# Patient Record
Sex: Female | Born: 1950 | Race: Black or African American | Hispanic: No | Marital: Single | Smoking: Former smoker
Health system: Southern US, Community
[De-identification: ages and names within clinical notes are randomized; demographics above are authoritative.]

## PROBLEM LIST (undated history)

## (undated) DIAGNOSIS — E119 Type 2 diabetes mellitus without complications: Secondary | ICD-10-CM

## (undated) DIAGNOSIS — E7800 Pure hypercholesterolemia, unspecified: Secondary | ICD-10-CM

## (undated) DIAGNOSIS — I10 Essential (primary) hypertension: Secondary | ICD-10-CM

## (undated) DIAGNOSIS — F329 Major depressive disorder, single episode, unspecified: Secondary | ICD-10-CM

## (undated) DIAGNOSIS — F32A Depression, unspecified: Secondary | ICD-10-CM

## (undated) DIAGNOSIS — F419 Anxiety disorder, unspecified: Secondary | ICD-10-CM

## (undated) DIAGNOSIS — M1990 Unspecified osteoarthritis, unspecified site: Secondary | ICD-10-CM

## (undated) HISTORY — DX: Depression, unspecified: F32.A

## (undated) HISTORY — DX: Major depressive disorder, single episode, unspecified: F32.9

## (undated) HISTORY — DX: Type 2 diabetes mellitus without complications: E11.9

## (undated) HISTORY — PX: COLONOSCOPY: SHX174

## (undated) HISTORY — DX: Anxiety disorder, unspecified: F41.9

---

## 2004-08-15 ENCOUNTER — Ambulatory Visit: Payer: Self-pay | Admitting: Internal Medicine

## 2005-09-18 ENCOUNTER — Ambulatory Visit: Payer: Self-pay | Admitting: Gastroenterology

## 2005-12-28 ENCOUNTER — Ambulatory Visit: Payer: Self-pay | Admitting: Internal Medicine

## 2006-01-15 ENCOUNTER — Ambulatory Visit: Payer: Self-pay | Admitting: Internal Medicine

## 2006-10-01 ENCOUNTER — Ambulatory Visit: Payer: Self-pay | Admitting: Internal Medicine

## 2007-03-07 ENCOUNTER — Ambulatory Visit: Payer: Self-pay | Admitting: Internal Medicine

## 2008-03-09 ENCOUNTER — Ambulatory Visit: Payer: Self-pay | Admitting: Internal Medicine

## 2009-09-09 ENCOUNTER — Ambulatory Visit: Payer: Self-pay | Admitting: Gastroenterology

## 2009-10-06 ENCOUNTER — Ambulatory Visit: Payer: Self-pay | Admitting: Internal Medicine

## 2010-03-10 ENCOUNTER — Ambulatory Visit: Payer: Self-pay | Admitting: Internal Medicine

## 2012-01-23 ENCOUNTER — Ambulatory Visit: Payer: Self-pay

## 2013-12-22 ENCOUNTER — Ambulatory Visit: Payer: Self-pay | Admitting: Internal Medicine

## 2014-06-06 DIAGNOSIS — E7800 Pure hypercholesterolemia, unspecified: Secondary | ICD-10-CM | POA: Insufficient documentation

## 2014-06-06 DIAGNOSIS — E1169 Type 2 diabetes mellitus with other specified complication: Secondary | ICD-10-CM | POA: Insufficient documentation

## 2014-06-06 DIAGNOSIS — E782 Mixed hyperlipidemia: Secondary | ICD-10-CM

## 2014-06-06 DIAGNOSIS — Z800 Family history of malignant neoplasm of digestive organs: Secondary | ICD-10-CM | POA: Insufficient documentation

## 2014-06-08 DIAGNOSIS — M170 Bilateral primary osteoarthritis of knee: Secondary | ICD-10-CM | POA: Insufficient documentation

## 2015-06-18 DIAGNOSIS — Z79899 Other long term (current) drug therapy: Secondary | ICD-10-CM | POA: Insufficient documentation

## 2015-06-22 DIAGNOSIS — E559 Vitamin D deficiency, unspecified: Secondary | ICD-10-CM | POA: Insufficient documentation

## 2016-01-04 ENCOUNTER — Other Ambulatory Visit: Payer: Self-pay | Admitting: Internal Medicine

## 2016-01-04 DIAGNOSIS — Z800 Family history of malignant neoplasm of digestive organs: Secondary | ICD-10-CM | POA: Diagnosis not present

## 2016-01-04 DIAGNOSIS — Z23 Encounter for immunization: Secondary | ICD-10-CM | POA: Diagnosis not present

## 2016-01-04 DIAGNOSIS — Z1239 Encounter for other screening for malignant neoplasm of breast: Secondary | ICD-10-CM | POA: Diagnosis not present

## 2016-01-04 DIAGNOSIS — E7800 Pure hypercholesterolemia, unspecified: Secondary | ICD-10-CM | POA: Diagnosis not present

## 2016-01-04 DIAGNOSIS — E559 Vitamin D deficiency, unspecified: Secondary | ICD-10-CM | POA: Diagnosis not present

## 2016-01-04 DIAGNOSIS — Z1231 Encounter for screening mammogram for malignant neoplasm of breast: Secondary | ICD-10-CM

## 2016-01-04 DIAGNOSIS — I10 Essential (primary) hypertension: Secondary | ICD-10-CM | POA: Diagnosis not present

## 2016-01-04 DIAGNOSIS — Z6841 Body Mass Index (BMI) 40.0 and over, adult: Secondary | ICD-10-CM | POA: Insufficient documentation

## 2016-01-04 DIAGNOSIS — M170 Bilateral primary osteoarthritis of knee: Secondary | ICD-10-CM | POA: Diagnosis not present

## 2016-01-04 DIAGNOSIS — Z780 Asymptomatic menopausal state: Secondary | ICD-10-CM | POA: Diagnosis not present

## 2016-01-04 DIAGNOSIS — J301 Allergic rhinitis due to pollen: Secondary | ICD-10-CM | POA: Diagnosis not present

## 2016-01-04 DIAGNOSIS — Z0000 Encounter for general adult medical examination without abnormal findings: Secondary | ICD-10-CM | POA: Diagnosis not present

## 2016-01-04 DIAGNOSIS — E119 Type 2 diabetes mellitus without complications: Secondary | ICD-10-CM | POA: Diagnosis not present

## 2016-01-11 DIAGNOSIS — Z780 Asymptomatic menopausal state: Secondary | ICD-10-CM | POA: Diagnosis not present

## 2016-01-13 ENCOUNTER — Other Ambulatory Visit: Payer: Self-pay | Admitting: Internal Medicine

## 2016-01-13 ENCOUNTER — Ambulatory Visit
Admission: RE | Admit: 2016-01-13 | Discharge: 2016-01-13 | Disposition: A | Discharge status: Home / Self Care | Payer: PPO | Source: Ambulatory Visit | Attending: Internal Medicine | Admitting: Internal Medicine

## 2016-01-13 DIAGNOSIS — Z1231 Encounter for screening mammogram for malignant neoplasm of breast: Secondary | ICD-10-CM | POA: Insufficient documentation

## 2016-03-28 DIAGNOSIS — Z800 Family history of malignant neoplasm of digestive organs: Secondary | ICD-10-CM | POA: Diagnosis not present

## 2016-03-28 DIAGNOSIS — K5730 Diverticulosis of large intestine without perforation or abscess without bleeding: Secondary | ICD-10-CM | POA: Diagnosis not present

## 2016-06-15 ENCOUNTER — Encounter: Payer: Self-pay | Admitting: *Deleted

## 2016-06-16 DIAGNOSIS — E7800 Pure hypercholesterolemia, unspecified: Secondary | ICD-10-CM | POA: Diagnosis not present

## 2016-06-16 DIAGNOSIS — E119 Type 2 diabetes mellitus without complications: Secondary | ICD-10-CM | POA: Diagnosis not present

## 2016-06-16 DIAGNOSIS — Z23 Encounter for immunization: Secondary | ICD-10-CM | POA: Diagnosis not present

## 2016-06-16 DIAGNOSIS — Z800 Family history of malignant neoplasm of digestive organs: Secondary | ICD-10-CM | POA: Diagnosis not present

## 2016-06-16 DIAGNOSIS — I10 Essential (primary) hypertension: Secondary | ICD-10-CM | POA: Diagnosis not present

## 2016-06-16 DIAGNOSIS — E559 Vitamin D deficiency, unspecified: Secondary | ICD-10-CM | POA: Diagnosis not present

## 2016-06-16 DIAGNOSIS — J301 Allergic rhinitis due to pollen: Secondary | ICD-10-CM | POA: Diagnosis not present

## 2016-06-19 ENCOUNTER — Ambulatory Visit
Admission: RE | Admit: 2016-06-19 | Discharge: 2016-06-19 | Disposition: A | Discharge status: Home / Self Care | Payer: PPO | Source: Ambulatory Visit | Attending: Gastroenterology | Admitting: Gastroenterology

## 2016-06-19 ENCOUNTER — Encounter: Payer: Self-pay | Admitting: *Deleted

## 2016-06-19 ENCOUNTER — Ambulatory Visit: Payer: PPO | Admitting: Anesthesiology

## 2016-06-19 ENCOUNTER — Encounter
Admission: RE | Disposition: A | Discharge status: Home / Self Care | Payer: Self-pay | Source: Ambulatory Visit | Attending: Gastroenterology

## 2016-06-19 DIAGNOSIS — I10 Essential (primary) hypertension: Secondary | ICD-10-CM | POA: Diagnosis not present

## 2016-06-19 DIAGNOSIS — Z86010 Personal history of colonic polyps: Secondary | ICD-10-CM | POA: Diagnosis not present

## 2016-06-19 DIAGNOSIS — E7800 Pure hypercholesterolemia, unspecified: Secondary | ICD-10-CM | POA: Insufficient documentation

## 2016-06-19 DIAGNOSIS — E119 Type 2 diabetes mellitus without complications: Secondary | ICD-10-CM | POA: Insufficient documentation

## 2016-06-19 DIAGNOSIS — Z79899 Other long term (current) drug therapy: Secondary | ICD-10-CM | POA: Insufficient documentation

## 2016-06-19 DIAGNOSIS — Z8371 Family history of colonic polyps: Secondary | ICD-10-CM | POA: Diagnosis not present

## 2016-06-19 DIAGNOSIS — M1990 Unspecified osteoarthritis, unspecified site: Secondary | ICD-10-CM | POA: Diagnosis not present

## 2016-06-19 DIAGNOSIS — Z6841 Body Mass Index (BMI) 40.0 and over, adult: Secondary | ICD-10-CM | POA: Diagnosis not present

## 2016-06-19 DIAGNOSIS — Z800 Family history of malignant neoplasm of digestive organs: Secondary | ICD-10-CM | POA: Insufficient documentation

## 2016-06-19 DIAGNOSIS — Z87891 Personal history of nicotine dependence: Secondary | ICD-10-CM | POA: Insufficient documentation

## 2016-06-19 DIAGNOSIS — Z7984 Long term (current) use of oral hypoglycemic drugs: Secondary | ICD-10-CM | POA: Insufficient documentation

## 2016-06-19 DIAGNOSIS — K5730 Diverticulosis of large intestine without perforation or abscess without bleeding: Secondary | ICD-10-CM | POA: Insufficient documentation

## 2016-06-19 DIAGNOSIS — Z1211 Encounter for screening for malignant neoplasm of colon: Secondary | ICD-10-CM | POA: Diagnosis not present

## 2016-06-19 HISTORY — DX: Type 2 diabetes mellitus without complications: E11.9

## 2016-06-19 HISTORY — DX: Pure hypercholesterolemia, unspecified: E78.00

## 2016-06-19 HISTORY — DX: Essential (primary) hypertension: I10

## 2016-06-19 HISTORY — PX: COLONOSCOPY: SHX5424

## 2016-06-19 HISTORY — DX: Unspecified osteoarthritis, unspecified site: M19.90

## 2016-06-19 LAB — GLUCOSE, CAPILLARY: Glucose-Capillary: 107 mg/dL — ABNORMAL HIGH (ref 65–99)

## 2016-06-19 SURGERY — COLONOSCOPY
Anesthesia: General

## 2016-06-19 MED ORDER — PROPOFOL 10 MG/ML IV BOLUS
INTRAVENOUS | Status: DC | PRN
  Administered 2016-06-19: 40 mg via INTRAVENOUS

## 2016-06-19 MED ORDER — PROPOFOL 500 MG/50ML IV EMUL
INTRAVENOUS | Status: DC | PRN
  Administered 2016-06-19: 150 ug/kg/min via INTRAVENOUS

## 2016-06-19 MED ORDER — SODIUM CHLORIDE 0.9 % IV SOLN: INTRAVENOUS | Status: DC

## 2016-06-19 MED ORDER — FENTANYL CITRATE (PF) 100 MCG/2ML IJ SOLN
INTRAMUSCULAR | Status: DC | PRN
Start: 2016-06-19 — End: 2016-06-19
  Administered 2016-06-19: 50 ug via INTRAVENOUS

## 2016-06-19 MED ORDER — SODIUM CHLORIDE 0.9 % IV SOLN
INTRAVENOUS | Status: DC
  Administered 2016-06-19: 09:00:00 via INTRAVENOUS

## 2016-06-19 NOTE — Anesthesia Postprocedure Evaluation (Signed)
Anesthesia Post Note  Patient: Gilford Rileatricia A Kielbasa  Procedure(s) Performed: Procedure(s) (LRB): COLONOSCOPY (N/A)  Patient location during evaluation: Endoscopy Anesthesia Type: General Level of consciousness: awake and alert Pain management: pain level controlled Vital Signs Assessment: post-procedure vital signs reviewed and stable Respiratory status: spontaneous breathing, nonlabored ventilation, respiratory function stable and patient connected to nasal cannula oxygen Cardiovascular status: blood pressure returned to baseline and stable Postop Assessment: no signs of nausea or vomiting Anesthetic complications: no    Last Vitals:  Vitals:   06/19/16 1029 06/19/16 1039  BP: (!) 144/83 (!) 154/93  Pulse: 60 (!) 47  Resp: 14 11  Temp:      Last Pain:  Vitals:   06/19/16 1009  TempSrc: Tympanic                 Cleda MccreedyJoseph K Kalia Vahey

## 2016-06-19 NOTE — H&P (Addendum)
Outpatient short stay form Pre-procedure 06/19/2016 9:15 AM Hayley DeemMartin U Jaziah Goeller MD  Primary Physician: Dr. Hal Moralesavid Theis  Reason for visit:  Colonoscopy  History of present illness:  Patient is a 65 year old female presenting today as above. She has had a colonoscopy in the past with finding of adenomatous colon polyp. There is also family history of colon cancer in primary relatives, mother. She tolerated her prep well. She takes no aspirin or blood thinning agents.    Current Facility-Administered Medications:  .  0.9 %  sodium chloride infusion, , Intravenous, Continuous, Hayley DeemMartin U Emmelyn Schmale, MD .  0.9 %  sodium chloride infusion, , Intravenous, Continuous, Hayley DeemMartin U Danell Verno, MD  Prescriptions Prior to Admission  Medication Sig Dispense Refill Last Dose  . atenolol (TENORMIN) 25 MG tablet Take by mouth daily.   06/19/2016 at 0600  . Calcium Carb-Cholecalciferol (CALCIUM CARBONATE-VITAMIN D3 PO) Take 1 tablet by mouth daily.   Past Week at Unknown time  . loratadine (CLARITIN) 10 MG tablet Take 10 mg by mouth daily.   Past Week at Unknown time  . metFORMIN (GLUCOPHAGE-XR) 500 MG 24 hr tablet Take 500 mg by mouth daily with breakfast.   06/17/2016  . Multiple Vitamin (MULTIVITAMIN) tablet Take 1 tablet by mouth daily.   Past Week at Unknown time  . potassium chloride (K-DUR) 10 MEQ tablet Take 10 mEq by mouth daily.   06/17/2016  . simvastatin (ZOCOR) 40 MG tablet Take 40 mg by mouth daily.   06/17/2016  . triamcinolone (NASACORT AQ) 55 MCG/ACT AERO nasal inhaler Place 2 sprays into the nose daily.   06/17/2016  . triamterene-hydrochlorothiazide (MAXZIDE-25) 37.5-25 MG tablet Take 1 tablet by mouth daily.   06/19/2016 at 0600     Allergies  Allergen Reactions  . Lisinopril Other (See Comments)    Headaches     Past Medical History:  Diagnosis Date  . Arthritis   . Diabetes mellitus without complication (HCC)   . Hypercholesterolemia   . Hypertension     Review of systems:       Physical Exam    Heart and lungs: Regular rate and rhythm without rub or gallop, lungs are bilaterally clear.    HEENT: Normocephalic atraumatic eyes are anicteric    Other:     Pertinant exam for procedure: Soft nontender nondistended bowel sounds positive normoactive.    Planned proceedures: Colonoscopy and indicated procedures. I have discussed the risks benefits and complications of procedures to include not limited to bleeding, infection, perforation and the risk of sedation and the patient wishes to proceed.    Hayley DeemMartin U Voyd Groft, MD Gastroenterology 06/19/2016  9:15 AM

## 2016-06-19 NOTE — Op Note (Signed)
[facility] Gastroenterology Patient Name: Hayley Carter Procedure Date: 06/19/2016 9:33 AM MRN: [id number] Account #: [id number] Date of Birth: Oct 13, 1950 Admit Type: Outpatient Age: 65 Room: [facility] ENDO ROOM 3 Gender: Female Note Status: Finalized Procedure:            Colonoscopy Indications:          Personal history of colonic polyps Providers:            Christena Deem, MD Referring MD:         Neomia Dear. Harrington Challenger, MD (Referring MD) Medicines:            Monitored Anesthesia Care Complications:        No immediate complications. Procedure:            Pre-Anesthesia Assessment:                       - ASA Grade Assessment: III - A patient with severe                        systemic disease.                       After obtaining informed consent, the colonoscope was                        passed under direct vision. Throughout the procedure,                        the patient's blood pressure, pulse, and oxygen                        saturations were monitored continuously. The                        Colonoscope was introduced through the anus and                        advanced to the the cecum, identified by appendiceal                        orifice and ileocecal valve. The colonoscopy was                        performed with moderate difficulty due to poor bowel                        prep. Successful completion of the procedure was aided                        by lavage. The quality of the bowel preparation was                        fair. Findings:      Multiple medium-mouthed diverticula were found in the sigmoid colon,       descending colon, transverse colon and ascending colon.      The digital rectal exam was normal.      The exam was otherwise without abnormality. It was difficult to maintain       insuflation due to poor anal sphincter tone. Impression:           -  Preparation of the colon was fair.                       - Diverticulosis in  the sigmoid colon, in the                        descending colon, in the transverse colon and in the                        ascending colon.                       - The examination was otherwise normal.                       - No specimens collected. Recommendation:       - Discharge patient to home.                       - Repeat colonoscopy in 5 years for surveillance. Procedure Code(s):    --- Professional ---                       [id number], Colonoscopy, flexible; diagnostic, including                        collection of specimen(s) by brushing or washing, when                        performed (separate procedure) Diagnosis Code(s):    --- Professional ---                       Z86.010, Personal history of colonic polyps                       K57.30, Diverticulosis of large intestine without                        perforation or abscess without bleeding CPT copyright 2016 American Medical Association. All rights reserved. The codes documented in this report are preliminary and upon coder review may  be revised to meet current compliance requirements. Christena DeemMartin U Osvaldo Lamping, MD 06/19/2016 10:10:39 AM This report has been signed electronically. Number of Addenda: 0 Note Initiated On: 06/19/2016 9:33 AM Scope Withdrawal Time: 0 hours 7 minutes 23 seconds  Total Procedure Duration: 0 hours 25 minutes 6 seconds       [facility]

## 2016-06-19 NOTE — Anesthesia Preprocedure Evaluation (Signed)
Anesthesia Evaluation  Patient identified by MRN, date of birth, ID band Patient awake    Reviewed: Allergy & Precautions, H&P , NPO status , Patient's Chart, lab work & pertinent test results  History of Anesthesia Complications Negative for: history of anesthetic complications  Airway Mallampati: III  TM Distance: >3 FB Neck ROM: full    Dental  (+) Poor Dentition   Pulmonary neg shortness of breath, former smoker,    Pulmonary exam normal breath sounds clear to auscultation       Cardiovascular Exercise Tolerance: Good hypertension, (-) angina(-) Past MI and (-) DOE Normal cardiovascular exam Rhythm:regular Rate:Normal     Neuro/Psych negative neurological ROS  negative psych ROS   GI/Hepatic negative GI ROS, Neg liver ROS,   Endo/Other  diabetes, Type obesity  Renal/GU negative Renal ROS  negative genitourinary   Musculoskeletal  (+) Arthritis ,   Abdominal   Peds  Hematology negative hematology ROS (+)   Anesthesia Other Findings Past Medical History: No date: Arthritis No date: Diabetes mellitus without complication (HCC) No date: Hypercholesterolemia No date: Hypertension  Past Surgical History: No date: COLONOSCOPY  BMI    Body Mass Index:  40.77 kg/m      Reproductive/Obstetrics negative OB ROS                             Anesthesia Physical Anesthesia Plan  ASA: III  Anesthesia Plan: General   Post-op Pain Management:    Induction:   Airway Management Planned:   Additional Equipment:   Intra-op Plan:   Post-operative Plan:   Informed Consent: I have reviewed the patients History and Physical, chart, labs and discussed the procedure including the risks, benefits and alternatives for the proposed anesthesia with the patient or authorized representative who has indicated his/her understanding and acceptance.   Dental Advisory Given  Plan  Discussed with: Anesthesiologist, CRNA and Surgeon  Anesthesia Plan Comments:         Anesthesia Quick Evaluation

## 2016-06-19 NOTE — Transfer of Care (Signed)
Immediate Anesthesia Transfer of Care Note  Patient: Hayley Carter  Procedure(s) Performed: Procedure(s): COLONOSCOPY (N/A)  Patient Location: PACU  Anesthesia Type:General  Level of Consciousness: awake, alert , oriented and patient cooperative  Airway & Oxygen Therapy: Patient Spontanous Breathing and Patient connected to nasal cannula oxygen  Post-op Assessment: Report given to RN and Post -op Vital signs reviewed and stable  Post vital signs: Reviewed and stable  Last Vitals:  Vitals:   06/19/16 0856 06/19/16 1009  BP: (!) 154/71 105/63  Pulse: 67 66  Resp: 14 17  Temp: 36.2 C (!) 35.8 C    Last Pain:  Vitals:   06/19/16 1009  TempSrc: Tympanic         Complications: No apparent anesthesia complications

## 2016-06-24 ENCOUNTER — Encounter: Payer: Self-pay | Admitting: Gastroenterology

## 2016-11-21 DIAGNOSIS — E119 Type 2 diabetes mellitus without complications: Secondary | ICD-10-CM | POA: Diagnosis not present

## 2016-12-15 DIAGNOSIS — I10 Essential (primary) hypertension: Secondary | ICD-10-CM | POA: Diagnosis not present

## 2016-12-15 DIAGNOSIS — E7800 Pure hypercholesterolemia, unspecified: Secondary | ICD-10-CM | POA: Diagnosis not present

## 2016-12-15 DIAGNOSIS — J301 Allergic rhinitis due to pollen: Secondary | ICD-10-CM | POA: Diagnosis not present

## 2016-12-15 DIAGNOSIS — E559 Vitamin D deficiency, unspecified: Secondary | ICD-10-CM | POA: Diagnosis not present

## 2016-12-15 DIAGNOSIS — Z1159 Encounter for screening for other viral diseases: Secondary | ICD-10-CM | POA: Diagnosis not present

## 2016-12-15 DIAGNOSIS — E119 Type 2 diabetes mellitus without complications: Secondary | ICD-10-CM | POA: Diagnosis not present

## 2016-12-15 DIAGNOSIS — Z79899 Other long term (current) drug therapy: Secondary | ICD-10-CM | POA: Diagnosis not present

## 2016-12-15 DIAGNOSIS — Z23 Encounter for immunization: Secondary | ICD-10-CM | POA: Diagnosis not present

## 2016-12-15 DIAGNOSIS — M170 Bilateral primary osteoarthritis of knee: Secondary | ICD-10-CM | POA: Diagnosis not present

## 2016-12-15 DIAGNOSIS — R001 Bradycardia, unspecified: Secondary | ICD-10-CM | POA: Diagnosis not present

## 2016-12-15 DIAGNOSIS — Z0000 Encounter for general adult medical examination without abnormal findings: Secondary | ICD-10-CM | POA: Diagnosis not present

## 2016-12-16 DIAGNOSIS — E538 Deficiency of other specified B group vitamins: Secondary | ICD-10-CM | POA: Insufficient documentation

## 2017-06-18 DIAGNOSIS — Z23 Encounter for immunization: Secondary | ICD-10-CM | POA: Diagnosis not present

## 2017-06-18 DIAGNOSIS — M170 Bilateral primary osteoarthritis of knee: Secondary | ICD-10-CM | POA: Diagnosis not present

## 2017-06-18 DIAGNOSIS — E538 Deficiency of other specified B group vitamins: Secondary | ICD-10-CM | POA: Diagnosis not present

## 2017-06-18 DIAGNOSIS — E559 Vitamin D deficiency, unspecified: Secondary | ICD-10-CM | POA: Diagnosis not present

## 2017-06-18 DIAGNOSIS — E119 Type 2 diabetes mellitus without complications: Secondary | ICD-10-CM | POA: Diagnosis not present

## 2017-06-18 DIAGNOSIS — J301 Allergic rhinitis due to pollen: Secondary | ICD-10-CM | POA: Diagnosis not present

## 2017-06-18 DIAGNOSIS — Z79899 Other long term (current) drug therapy: Secondary | ICD-10-CM | POA: Diagnosis not present

## 2017-06-18 DIAGNOSIS — Z0000 Encounter for general adult medical examination without abnormal findings: Secondary | ICD-10-CM | POA: Diagnosis not present

## 2017-06-18 DIAGNOSIS — I10 Essential (primary) hypertension: Secondary | ICD-10-CM | POA: Diagnosis not present

## 2017-06-18 DIAGNOSIS — E7800 Pure hypercholesterolemia, unspecified: Secondary | ICD-10-CM | POA: Diagnosis not present

## 2017-10-29 ENCOUNTER — Ambulatory Visit (INDEPENDENT_AMBULATORY_CARE_PROVIDER_SITE_OTHER): Payer: PPO | Admitting: Psychiatry

## 2017-10-29 ENCOUNTER — Other Ambulatory Visit: Payer: Self-pay

## 2017-10-29 ENCOUNTER — Encounter: Payer: Self-pay | Admitting: Psychiatry

## 2017-10-29 VITALS — BP 162/73 | HR 85 | Temp 98.4°F | Wt 230.0 lb

## 2017-10-29 DIAGNOSIS — I10 Essential (primary) hypertension: Secondary | ICD-10-CM | POA: Insufficient documentation

## 2017-10-29 DIAGNOSIS — J302 Other seasonal allergic rhinitis: Secondary | ICD-10-CM | POA: Insufficient documentation

## 2017-10-29 DIAGNOSIS — E119 Type 2 diabetes mellitus without complications: Secondary | ICD-10-CM | POA: Insufficient documentation

## 2017-10-29 DIAGNOSIS — M5020 Other cervical disc displacement, unspecified cervical region: Secondary | ICD-10-CM | POA: Insufficient documentation

## 2017-10-29 DIAGNOSIS — F320 Major depressive disorder, single episode, mild: Secondary | ICD-10-CM

## 2017-10-29 DIAGNOSIS — Z91199 Patient's noncompliance with other medical treatment and regimen due to unspecified reason: Secondary | ICD-10-CM | POA: Insufficient documentation

## 2017-10-29 DIAGNOSIS — Z9119 Patient's noncompliance with other medical treatment and regimen: Secondary | ICD-10-CM | POA: Insufficient documentation

## 2017-10-29 MED ORDER — GABAPENTIN 100 MG PO CAPS: 100.0000 mg | ORAL_CAPSULE | Freq: Every day | ORAL | 1 refills | Status: DC

## 2017-10-29 NOTE — Progress Notes (Signed)
Psychiatric Initial Adult Assessment   Patient Identification: Hayley Carter MRN:  [id number] Date of Evaluation:  10/29/2017 Referral Source: Mickey Farber MD Chief Complaint:  ' I have these headaches.' Chief Complaint    Establish Care; Anxiety; Depression; Other     Visit Diagnosis: R/O Somatic symptom disorder , with predominant pain   ICD-10-CM   1. MDD (major depressive disorder), single episode, mild (HCC) F32.0 gabapentin (NEURONTIN) 100 MG capsule    History of Present Illness:  Hayley Carter is a 67 year old African-American female, single, retired, lives in [place], has a history of recent onset depression, headache, history of hypertension, hyperlipidemia, diabetes mellitus, presented to the clinic today to establish care.  Moni reports that she started noticing some depressive symptoms after the death of her brother who passed away in 09/05/17.  She reports her brother had cancer.  She reports soon after that she started noticing some sadness, lack of appetite, sleep problems as well as voices.  She reports hx of hearing voices a month or so ago. She reports her AH as hearing her doctor's voice, nurse's voice, older sister's voice and so on.  She reports she would hear  Conversations about what  they talked about in the past in her head, usually at night.  She reports this affected her sleep.  She also reports she started noticing these headaches which are always unilateral, right-sided.  She reports she was started on Paxil by her primary medical doctor.  She is on 20 mg right now.  She reports she started taking it on 9th, January.  She denies any side effects from the Paxil.  She reports after being on the Paxil she feels better.  She reports her mornings used to be worse.  She did not have any motivation to do anything.  She however reports that Paxil has been helping her to take care of her things at home.  She reports she feels energetic towards the afternoon and she  is able to do the chores around her house.  She denies any significant panic symptoms or worrying at this time.  She denies any voices or paranoia at this time.  She denies any suicidality or homicidality at this time.  She denies any manic or hypomanic symptoms.  She reports other than her headaches which continues to bother her ,she feels the Paxil is effective.    She reports her medical problems are being managed by her primary medical doctor.  She is on vitamin B12 replacement as well as vitamin D replacement.  She reports her high blood pressure is also being managed by her PMD, she is on 2 antihypertensive medications.  Her blood pressure however today seems to be elevated.  Discussed with her that blood pressure variations can can also cause headaches.  Discussed with her to monitor it closely and reach out to her PMD.  Associated Signs/Symptoms: Depression Symptoms:  anhedonia, loss of energy/fatigue, (Hypo) Manic Symptoms:  denies Anxiety Symptoms:  denies Psychotic Symptoms:  Hallucinations: Auditory PTSD Symptoms: Negative  Past Psychiatric History: She was recently started on Paxil by PMD for depression.  She denies any suicidality.  She denies any inpatient mental health admissions.  Previous Psychotropic Medications: Yes , Paxil.  Substance Abuse History in the last 12 months:  No.  Consequences of Substance Abuse: Negative  Past Medical History:  Past Medical History:  Diagnosis Date  . Anxiety   . Arthritis   . Depression   . Diabetes mellitus without complication (HCC)   .  Diabetes mellitus, type II (HCC)   . Hypercholesterolemia   . Hypertension     Past Surgical History:  Procedure Laterality Date  . COLONOSCOPY    . COLONOSCOPY N/A 06/19/2016   Procedure: COLONOSCOPY;  Surgeon: Christena Deem, MD;  [facility]: [facility] ENDOSCOPY;  Service: Endoscopy;  Laterality: N/A;    Family Psychiatric History: Nephew-anxiety, mother-anxiety  Family History:   Family History  Problem Relation Age of Onset  . Colon cancer Mother   . Hypertension Mother   . Stroke Father   . Heart attack Father   . Ulcers Father   . Hypertension Father   . Migraines Sister   . Hypertension Sister   . Hypertension Brother   . Aneurysm Paternal Grandmother   . Prostate cancer Paternal Uncle   . Anxiety disorder Other   . Breast cancer Neg Hx     Social History:   Social History   Socioeconomic History  . Marital status: Single    Spouse name: None  . Number of children: 0  . Years of education: None  . Highest education level: Some college, no degree  Social Needs  . Financial resource strain: Not hard at all  . Food insecurity - worry: Never true  . Food insecurity - inability: Never true  . Transportation needs - medical: No  . Transportation needs - non-medical: No  Occupational History    Comment: retired  Tobacco Use  . Smoking status: Former Smoker    Packs/day: 0.25    Years: 25.00    Pack years: 6.25    Types: Cigarettes    Last attempt to quit: 09/04/1998    Years since quitting: 19.1  . Smokeless tobacco: Never Used  Substance and Sexual Activity  . Alcohol use: No  . Drug use: No  . Sexual activity: No  Other Topics Concern  . None  Social History Narrative  . None    Additional Social History: She is single.  She lives in [place].  She went up to 12th grade and also did some computer courses.  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  She denies having children.  She does have social support system from her sister.  Allergies:   Allergies  Allergen Reactions  . Lisinopril Other (See Comments)    Headaches    Metabolic Disorder Labs: No results found for: HGBA1C, MPG No results found for: PROLACTIN No results found for: CHOL, TRIG, HDL, CHOLHDL, VLDL, LDLCALC   Current Medications: Current Outpatient Medications  Medication Sig Dispense Refill  . atenolol (TENORMIN) 25 MG tablet Take by  mouth daily.    . Calcium Carb-Cholecalciferol (CALCIUM CARBONATE-VITAMIN D3 PO) Take 1 tablet by mouth daily.    Marland Kitchen loratadine (CLARITIN) 10 MG tablet Take 10 mg by mouth daily.    . metFORMIN (GLUCOPHAGE-XR) 500 MG 24 hr tablet Take 500 mg by mouth daily with breakfast.    . Multiple Vitamin (MULTIVITAMIN) tablet Take 1 tablet by mouth daily.    Marland Kitchen PARoxetine (PAXIL) 20 MG tablet Take by mouth.    . potassium chloride (K-DUR) 10 MEQ tablet Take 10 mEq by mouth daily.    . simvastatin (ZOCOR) 40 MG tablet Take 40 mg by mouth daily.    Marland Kitchen triamcinolone (NASACORT AQ) 55 MCG/ACT AERO nasal inhaler Place 2 sprays into the nose daily.    Marland Kitchen triamterene-hydrochlorothiazide (MAXZIDE-25) 37.5-25 MG tablet Take 1 tablet by mouth daily.    Marland Kitchen gabapentin (NEURONTIN) 100 MG  capsule Take 1 capsule (100 mg total) by mouth at bedtime. 30 capsule 1   No current facility-administered medications for this visit.     Neurologic: Headache: Yes Seizure: No Paresthesias:No  Musculoskeletal: Strength & Muscle Tone: within normal limits Gait & Station: normal Patient leans: N/A  Psychiatric Specialty Exam: Review of Systems  HENT:       Headache  Psychiatric/Behavioral: The patient is nervous/anxious.   All other systems reviewed and are negative.   Blood pressure (!) 162/73, pulse 85, temperature 98.4 F (36.9 C), temperature source Oral, weight 230 lb (104.3 kg).Body mass index is 38.27 kg/m.  General Appearance: Casual  Eye Contact:  Fair  Speech:  Clear and Coherent  Volume:  Normal  Mood:  Dysphoric  Affect:  Congruent  Thought Process:  Goal Directed and Descriptions of Associations: Intact  Orientation:  Full (Time, Place, and Person)  Thought Content:  Logical  Suicidal Thoughts:  No  Homicidal Thoughts:  No  Memory:  Immediate;   Fair Recent;   Fair Remote;   Fair  Judgement:  Fair  Insight:  Fair  Psychomotor Activity:  Normal  Concentration:  Concentration: Fair and Attention Span:  Fair  Recall:  F[employer]Fair  Fund of Knowledge:Fair  Language: Fair  Akathisia:  No  Handed:  Right  AIMS (if indicated):NA  Assets:  Communication Skills Desire for Improvement Financial Resources/Insurance Housing Social Support  ADL's:  Intact  Cognition: WNL  Sleep: fair    Treatment Plan Summary: Elease Hashimotoatricia is a 67 year old African-American female who has a history of recent onset depressive symptoms, history of hypertension, diabetes mellitus, hyperlipidemia, vitamin B12 and vitamin D deficiency, presented to the clinic today to establish care.  Patient today reports the Paxil which was started by her PMD for depressive symptoms as effective.  She however continues to struggle with headaches.  She denies any psychosocial stressors except for the recent death of her brother.  Patient denies any suicidality.  She reports her perceptual disturbances are resolved.  She denies any substance abuse problems.  Plan as noted below. Medication management and Plan see below Plan For MDD Continue Paxil 20 mg p.o. daily Start gabapentin 100 mg p.o. nightly. PHQ 9 equals 3.  For rule out somatic symptom disorder Will need to rule out all medical causes of headaches . She is already on vitamin B12 ,vitamin D replacement. She does struggle with seasonal allergies and is on medications for the same. Blood pressure is elevated, which will need follow-up with PMD. If she continues to have significant headaches she will need a referral to a neurologist. Will also need to R/O OSA.  [facility].  She will need CBT/grief therapy.  Follow up in clinic in 4 weeks or sooner if needed.  More than 50 % of the time was spent for psychoeducation and supportive psychotherapy and care coordination.  This note was generated in part or whole with voice recognition software. Voice recognition is usually quite accurate but there are transcription errors that can and very often do occur.  I apologize for any typographical errors that were not detected and corrected.       Jomarie LongsSaramma Aislynn Cifelli, MD 2/25/20194:11 PM

## 2017-10-29 NOTE — Patient Instructions (Signed)
Gabapentin capsules or tablets What is this medicine? GABAPENTIN (GA ba pen tin) is used to control partial seizures in adults with epilepsy. It is also used to treat certain types of nerve pain. This medicine may be used for other purposes; ask your health care provider or pharmacist if you have questions. COMMON BRAND NAME(S): Active-PAC with Gabapentin, Gabarone, Neurontin What should I tell my health care provider before I take this medicine? They need to know if you have any of these conditions: -kidney disease -suicidal thoughts, plans, or attempt; a previous suicide attempt by you or a family member -an unusual or allergic reaction to gabapentin, other medicines, foods, dyes, or preservatives -pregnant or trying to get pregnant -breast-feeding How should I use this medicine? Take this medicine by mouth with a glass of water. Follow the directions on the prescription label. You can take it with or without food. If it upsets your stomach, take it with food.Take your medicine at regular intervals. Do not take it more often than directed. Do not stop taking except on your doctor's advice. If you are directed to break the 600 or 800 mg tablets in half as part of your dose, the extra half tablet should be used for the next dose. If you have not used the extra half tablet within 28 days, it should be thrown away. A special MedGuide will be given to you by the pharmacist with each prescription and refill. Be sure to read this information carefully each time. Talk to your pediatrician regarding the use of this medicine in children. Special care may be needed. Overdosage: If you think you have taken too much of this medicine contact a poison control center or emergency room at once. NOTE: This medicine is only for you. Do not share this medicine with others. What if I miss a dose? If you miss a dose, take it as soon as you can. If it is almost time for your next dose, take only that dose. Do not  take double or extra doses. What may interact with this medicine? Do not take this medicine with any of the following medications: -other gabapentin products This medicine may also interact with the following medications: -alcohol -antacids -antihistamines for allergy, cough and cold -certain medicines for anxiety or sleep -certain medicines for depression or psychotic disturbances -homatropine; hydrocodone -naproxen -narcotic medicines (opiates) for pain -phenothiazines like chlorpromazine, mesoridazine, prochlorperazine, thioridazine This list may not describe all possible interactions. Give your health care provider a list of all the medicines, herbs, non-prescription drugs, or dietary supplements you use. Also tell them if you smoke, drink alcohol, or use illegal drugs. Some items may interact with your medicine. What should I watch for while using this medicine? Visit your doctor or health care professional for regular checks on your progress. You may want to keep a record at home of how you feel your condition is responding to treatment. You may want to share this information with your doctor or health care professional at each visit. You should contact your doctor or health care professional if your seizures get worse or if you have any new types of seizures. Do not stop taking this medicine or any of your seizure medicines unless instructed by your doctor or health care professional. Stopping your medicine suddenly can increase your seizures or their severity. Wear a medical identification bracelet or chain if you are taking this medicine for seizures, and carry a card that lists all your medications. You may get drowsy, dizzy,   or have blurred vision. Do not drive, use machinery, or do anything that needs mental alertness until you know how this medicine affects you. To reduce dizzy or fainting spells, do not sit or stand up quickly, especially if you are an older patient. Alcohol can  increase drowsiness and dizziness. Avoid alcoholic drinks. Your mouth may get dry. Chewing sugarless gum or sucking hard candy, and drinking plenty of water will help. The use of this medicine may increase the chance of suicidal thoughts or actions. Pay special attention to how you are responding while on this medicine. Any worsening of mood, or thoughts of suicide or dying should be reported to your health care professional right away. Women who become pregnant while using this medicine may enroll in the [place] Antiepileptic Drug Pregnancy Registry by calling 1-888-233-2334. This registry collects information about the safety of antiepileptic drug use during pregnancy. What side effects may I notice from receiving this medicine? Side effects that you should report to your doctor or health care professional as soon as possible: -allergic reactions like skin rash, itching or hives, swelling of the face, lips, or tongue -worsening of mood, thoughts or actions of suicide or dying Side effects that usually do not require medical attention (report to your doctor or health care professional if they continue or are bothersome): -constipation -difficulty walking or controlling muscle movements -dizziness -nausea -slurred speech -tiredness -tremors -weight gain This list may not describe all possible side effects. Call your doctor for medical advice about side effects. You may report side effects to FDA at 1-800-FDA-1088. Where should I keep my medicine? Keep out of reach of children. This medicine may cause accidental overdose and death if it taken by other adults, children, or pets. Mix any unused medicine with a substance like cat litter or coffee grounds. Then throw the medicine away in a sealed container like a sealed bag or a coffee can with a lid. Do not use the medicine after the expiration date. Store at room temperature between 15 and 30 degrees C (59 and 86 degrees F). NOTE: This  sheet is a summary. It may not cover all possible information. If you have questions about this medicine, talk to your doctor, pharmacist, or health care provider.  2018 Elsevier/Gold Standard (2013-10-17 15:26:50)  

## 2017-11-21 ENCOUNTER — Ambulatory Visit (INDEPENDENT_AMBULATORY_CARE_PROVIDER_SITE_OTHER): Payer: PPO | Admitting: Licensed Clinical Social Worker

## 2017-11-21 DIAGNOSIS — F320 Major depressive disorder, single episode, mild: Secondary | ICD-10-CM | POA: Diagnosis not present

## 2017-11-21 NOTE — Progress Notes (Signed)
Comprehensive Clinical Assessment (CCA) Note  11/21/2017 Hayley Carter [id number]  Visit Diagnosis:      ICD-10-CM   1. MDD (major depressive disorder), single episode, mild (HCC) F32.0       CCA Part One  Part One has been completed on paper by the patient.  (See scanned document in Chart Review)  CCA Part Two A  Intake/Chief Complaint:  CCA Intake With Chief Complaint CCA Part Two Date: 11/21/17 CCA Part Two Time: 1405 Chief Complaint/Presenting Problem: Dr. Elna Breslow scheduled this for me.  I am going through grief, anxiety and depression.  Patients Currently Reported Symptoms/Problems: I started hearing voices in January 2019.  After the medication began working I stopped hearing the voices. At my diabetes class I was informed that I would hear voices.  I am now having headaches.  I take medication for it.  I sleep well now.  I don't have an appetite but I eat 3 meals a day plus one snack.  I take my metformin with my dinner. Reports that she lacks motivation to do things. Reports that she felt bad physically and had no motivation to get dressed or get out of the bed. Reports that her brother died Sep 22, 2017.   Individual's Strengths: pretty much everything Individual's Preferences: be able to go back to work [employer]: communicates well Type of Services Patient Feels Are Needed: therapy  Mental Health Symptoms Depression:  Depression: Change in energy/activity, Increase/decrease in appetite, Fatigue, Difficulty Concentrating  Mania:  Mania: N/A  Anxiety:   Anxiety: N/A  Psychosis:  Psychosis: N/A  Trauma:  Trauma: N/A  Obsessions:  Obsessions: N/A  Compulsions:  Compulsions: N/A  Inattention:  Inattention: N/A  Hyperactivity/Impulsivity:  Hyperactivity/Impulsivity: N/A  Oppositional/Defiant Behaviors:  Oppositional/Defiant Behaviors: N/A  Borderline Personality:  Emotional Irregularity: N/A  Other Mood/Personality Symptoms:      Mental Status  Exam Appearance and self-care  Stature:  Stature: Average  Weight:  Weight: Obese  Clothing:  Clothing: Neat/clean  Grooming:  Grooming: Normal  Cosmetic use:  Cosmetic Use: Age appropriate  Posture/gait:  Posture/Gait: Normal  Motor activity:  Motor Activity: Not Remarkable  Sensorium  Attention:  Attention: Normal  Concentration:  Concentration: Normal  Orientation:  Orientation: X5  Recall/memory:  Recall/Memory: Normal  Affect and Mood  Affect:  Affect: Appropriate  Mood:  Mood: Pessimistic  Relating  Eye contact:  Eye Contact: Normal  Facial expression:  Facial Expression: Responsive  Attitude toward examiner:  Attitude Toward Examiner: Cooperative  Thought and Language  Speech flow: Speech Flow: Normal  Thought content:  Thought Content: Appropriate to mood and circumstances  Preoccupation:     Hallucinations:     Organization:     Company secretary of Knowledge:  Fund of Knowledge: Average  Intelligence:  Intelligence: Average  Abstraction:  Abstraction: Normal  Judgement:  Judgement: Fair  Dance movement psychotherapist:  Reality Testing: Adequate  Insight:  Insight: Good  Decision Making:  Decision Making: Normal  Social Functioning  Social Maturity:  Social Maturity: Responsible  Social Judgement:  Social Judgement: Naive  Stress  Stressors:  Stressors: Family conflict, Transitions, Work  Coping Ability:  Coping Ability: Normal  Skill Deficits:     Supports:      Family and Psychosocial History: Family history Marital status: Single Are you sexually active?: No What is your sexual orientation?: heterosexual Does patient have children?: No  Childhood History:  Childhood History By whom was/is the patient raised?: Both parents Additional childhood history  information: Born in [place].  Describes childhood as: great, loving parents. Description of patient's relationship with caregiver when they were a child: Mother: great relationship.  she was loving  and she stayed at home.  Father: good. Patient's description of current relationship with people who raised him/her: Both parents deceased How were you disciplined when you got in trouble as a child/adolescent?: spankings Does patient have siblings?: Yes Number of Siblings: 4(Don 65, Magadeline 68, Norma late 10s, Jim late 63s) Description of patient's current relationship with siblings: Roe Coombs recently died of cancer.  We are close and get along with each other.  We all live close to each other.  Did patient suffer any verbal/emotional/physical/sexual abuse as a child?: No Did patient suffer from severe childhood neglect?: No Has patient ever been sexually abused/assaulted/raped as an adolescent or adult?: No Was the patient ever a victim of a crime or a disaster?: No Witnessed domestic violence?: No Has patient been effected by domestic violence as an adult?: No  CCA Part Two B  Employment/Work Situation: Employment / Work Psychologist, occupational Employment situation: Retired Psychologist, clinical job has been impacted by current illness: No What is the longest time patient has a held a job?: 12 Where was the patient employed at that time?: [place] Has patient ever been in the [employer]?: No  Education: Education Name of [employer] School: [employer] in 1970 Did You Graduate From [employer]?: Yes Did You Attend College?: Yes What Type of College Degree Do you Have?: Computer classes at [employer] in 2009 Did You Have An Individualized Education Program (IIEP): No Did You Have Any Difficulty At [employer]?: No  Religion: Religion/Spirituality Are You A Religious Person?: Yes What is Your Religious Affiliation?: Christian How Might This Affect Treatment?: denies  Leisure/Recreation: Leisure / Recreation Leisure and Hobbies: all sports, crafts, reading, spending time with people  Exercise/Diet: Exercise/Diet Do You Exercise?: Yes What Type of Exercise Do You Do?: Run/Walk How Many Times a Week Do You  Exercise?: 4-5 times a week Have You Gained or Lost A Significant Amount of Weight in the Past Six Months?: No Do You Follow a Special Diet?: No Do You Have Any Trouble Sleeping?: No  CCA Part Two C  Alcohol/Drug Use: Alcohol / Drug Use Pain Medications: denies Prescriptions: Klor-Con, Metformin, Paroxetine HCL, Atenolol 25mg , Simvastatin 40mg , Gabapentin, Triamterene HCTZ Over the Counter: B12, Multivitamin, Vitamin D, [place]scal History of alcohol / drug use?: No history of alcohol / drug abuse                      CCA Part Three  ASAM's:  Six Dimensions of Multidimensional Assessment  Dimension 1:  Acute Intoxication and/or Withdrawal Potential:     Dimension 2:  Biomedical Conditions and Complications:     Dimension 3:  Emotional, Behavioral, or Cognitive Conditions and Complications:     Dimension 4:  Readiness to Change:     Dimension 5:  Relapse, Continued use, or Continued Problem Potential:     Dimension 6:  Recovery/Living Environment:      Substance use Disorder (SUD)    Social Function:  Social Functioning Social Maturity: Responsible Social Judgement: Naive  Stress:  Stress Stressors: Family conflict, Transitions, Work Coping Ability: Normal Patient Takes Medications The Way The Doctor Instructed?: Yes Priority Risk: Low Acuity  Risk Assessment- Self-Harm Potential: Risk Assessment For Self-Harm Potential Thoughts of Self-Harm: No current thoughts Method: No plan Availability of Means: No access/NA  Risk Assessment -Dangerous to  Others Potential: Risk Assessment For Dangerous to Others Potential Method: No Plan Availability of Means: No access or NA Intent: Vague intent or NA Notification Required: No need or identified person  DSM5 Diagnoses: Patient Active Problem List   Diagnosis Date Noted  . Seasonal allergic rhinitis 10/29/2017  . Ruptured disc, cervical 10/29/2017  . Noncompliance 10/29/2017  . Hypertension 10/29/2017  . Diabetes  (HCC) 10/29/2017  . Vitamin B12 deficiency 12/16/2016  . BMI 40.0-44.9, adult (HCC) 01/04/2016  . Vitamin D deficiency, unspecified 06/22/2015  . High risk medication use 06/18/2015  . Osteoarthritis of knees, bilateral 06/08/2014  . Hypercholesterolemia 06/06/2014  . Family history of colon cancer 06/06/2014    Patient Centered Plan: Patient is on the following Treatment Plan(s):  Depression  Recommendations for Services/Supports/Treatments: Recommendations for Services/Supports/Treatments Recommendations For Services/Supports/Treatments: Individual Therapy, Medication Management  Treatment Plan Summary:    Referrals to Alternative Service(s): Referred to Alternative Service(s):   Place:   Date:   Time:    Referred to Alternative Service(s):   Place:   Date:   Time:    Referred to Alternative Service(s):   Place:   Date:   Time:    Referred to Alternative Service(s):   Place:   Date:   Time:     Marinda Elkicole M Antwaine Boomhower

## 2017-11-26 ENCOUNTER — Other Ambulatory Visit: Payer: Self-pay

## 2017-11-26 ENCOUNTER — Ambulatory Visit (INDEPENDENT_AMBULATORY_CARE_PROVIDER_SITE_OTHER): Payer: PPO | Admitting: Psychiatry

## 2017-11-26 ENCOUNTER — Encounter: Payer: Self-pay | Admitting: Psychiatry

## 2017-11-26 VITALS — BP 151/81 | HR 76 | Temp 98.6°F | Wt 236.2 lb

## 2017-11-26 DIAGNOSIS — F320 Major depressive disorder, single episode, mild: Secondary | ICD-10-CM

## 2017-11-26 MED ORDER — GABAPENTIN 100 MG PO CAPS: 100.0000 mg | ORAL_CAPSULE | Freq: Two times a day (BID) | ORAL | 2 refills | Status: DC

## 2017-11-26 NOTE — Progress Notes (Signed)
BH MD  OP Progress Note  11/26/2017 5:07 PM Hayley Carter  MRN:  [id number]  Chief Complaint: ' I am ok." Chief Complaint    Follow-up; Medication Refill; Other     HPI: Hayley Hashimotoatricia is a 67 year old African-American female, single, retired, lives in [place], has a history of recent onset depression, headaches, history of hypertension, hyperlipidemia, diabetes mellitus, presented to the clinic today for a follow-up visit.  Patient today reports that she has been doing well with her depressive symptoms.  She denies any significant sadness, appetite changes, anxiety symptoms.  She reports she has been sleeping better with the gabapentin.  She continues to have some headaches on and off.  She reports her headaches are not too severe and she is able to function daily.  She reports she did not have any headaches this morning but once she came to the clinic she noticed the headaches as coming back.  She reports she tolerated the gabapentin well.  She denies any side effects.  She otherwise denies any concerns.  She continues to have good social support from her family.  She reports her blood pressure continues to vary.  She reports she was able to keep a log of her blood pressure.  On review of her blood pressure log looks like she is been having a few high readings.  Discussed with her to talk to her primary medical doctor about the same.  She continues to be on vitamin B12 replacement as well as vitamin D replacement.  Also has seasonal allergies which can also contribute to headaches.  Visit Diagnosis: R/O Somatic symptom disorder   ICD-10-CM   1. MDD (major depressive disorder), single episode, mild (HCC) F32.0 gabapentin (NEURONTIN) 100 MG capsule    Past Psychiatric History: Recently started on Paxil by PMD for depression.  She denies any suicidality.  She denies any inpatient mental health admissions.  Past Medical History:  Past Medical History:  Diagnosis Date  . Anxiety   .  Arthritis   . Depression   . Diabetes mellitus without complication (HCC)   . Diabetes mellitus, type II (HCC)   . Hypercholesterolemia   . Hypertension     Past Surgical History:  Procedure Laterality Date  . COLONOSCOPY    . COLONOSCOPY N/A 06/19/2016   Procedure: COLONOSCOPY;  Surgeon: Christena DeemMartin U Skulskie, MD;  [facility];  Service: Endoscopy;  Laterality: N/A;    Family Psychiatric History: Nephew-anxiety, mother-anxiety.  Family History:  Family History  Problem Relation Age of Onset  . Colon cancer Mother   . Hypertension Mother   . Stroke Father   . Heart attack Father   . Ulcers Father   . Hypertension Father   . Migraines Sister   . Hypertension Sister   . Hypertension Brother   . Aneurysm Paternal Grandmother   . Prostate cancer Paternal Uncle   . Anxiety disorder Other   . Breast cancer Neg Hx    Substance abuse history: Denies  Social History: Single.  She lives in [place].  She went up to 12th grade and also did some computer courses.  She used to work in [employer].  She is currently retired.  She denies having children.  She does have social support system from her sister. Social History   Socioeconomic History  . Marital status: Single    Spouse name: Not on file  . Number of children: 0  . Years of education: Not on file  . Highest education level: Some college,  no degree  Occupational History    Comment: retired  Engineer, production  . Financial resource strain: Not hard at all  . Food insecurity:    Worry: Never true    Inability: Never true  . Transportation needs:    Medical: No    Non-medical: No  Tobacco Use  . Smoking status: Former Smoker    Packs/day: 0.25    Years: 25.00    Pack years: 6.25    Types: Cigarettes    Last attempt to quit: 09/04/1998    Years since quitting: 19.2  . Smokeless tobacco: Never Used  Substance and Sexual Activity  . Alcohol use: No  . Drug use: No  . Sexual activity: Never  Lifestyle  .  Physical activity:    Days per week: 1 day    Minutes per session: 30 min  . Stress: Not at all  Relationships  . Social connections:    Talks on phone: Twice a week    Gets together: Never    Attends religious service: Never    Active member of club or organization: No    Attends meetings of clubs or organizations: Never    Relationship status: Never married  Other Topics Concern  . Not on file  Social History Narrative  . Not on file    Allergies:  Allergies  Allergen Reactions  . Lisinopril Other (See Comments)    Headaches    Metabolic Disorder Labs: No results found for: HGBA1C, MPG No results found for: PROLACTIN No results found for: CHOL, TRIG, HDL, CHOLHDL, VLDL, LDLCALC No results found for: TSH  Therapeutic Level Labs: No results found for: LITHIUM No results found for: VALPROATE No components found for:  CBMZ  Current Medications: Current Outpatient Medications  Medication Sig Dispense Refill  . atenolol (TENORMIN) 25 MG tablet Take by mouth daily.    . Calcium Carb-Cholecalciferol (CALCIUM CARBONATE-VITAMIN D3 PO) Take 1 tablet by mouth daily.    Marland Kitchen gabapentin (NEURONTIN) 100 MG capsule Take 1 capsule (100 mg total) by mouth 2 (two) times daily. 60 capsule 2  . loratadine (CLARITIN) 10 MG tablet Take 10 mg by mouth daily.    . metFORMIN (GLUCOPHAGE-XR) 500 MG 24 hr tablet Take 500 mg by mouth daily with breakfast.    . Multiple Vitamin (MULTIVITAMIN) tablet Take 1 tablet by mouth daily.    Marland Kitchen PARoxetine (PAXIL) 20 MG tablet Take by mouth.    . potassium chloride (K-DUR) 10 MEQ tablet Take 10 mEq by mouth daily.    . simvastatin (ZOCOR) 40 MG tablet Take 40 mg by mouth daily.    Marland Kitchen triamcinolone (NASACORT AQ) 55 MCG/ACT AERO nasal inhaler Place 2 sprays into the nose daily.    Marland Kitchen triamterene-hydrochlorothiazide (MAXZIDE-25) 37.5-25 MG tablet Take 1 tablet by mouth daily.     No current facility-administered medications for this visit.       Musculoskeletal: Strength & Muscle Tone: within normal limits Gait & Station: normal Patient leans: N/A  Psychiatric Specialty Exam: Review of Systems  HENT:       Headaches  Psychiatric/Behavioral: The patient is nervous/anxious and has insomnia.   All other systems reviewed and are negative.   Blood pressure (!) 151/81, pulse 76, temperature 98.6 F (37 C), temperature source Oral, weight 236 lb 3.2 oz (107.1 kg).Body mass index is 39.31 kg/m.  General Appearance: Casual  Eye Contact:  Fair  Speech:  Clear and Coherent  Volume:  Normal  Mood:  Euthymic  Affect:  Congruent  Thought Process:  Goal Directed and Descriptions of Associations: Intact  Orientation:  Full (Time, Place, and Person)  Thought Content: Logical   Suicidal Thoughts:  No  Homicidal Thoughts:  No  Memory:  Immediate;   Fair Recent;   Fair Remote;   Fair  Judgement:  Fair  Insight:  Fair  Psychomotor Activity:  Normal  Concentration:  Concentration: Fair and Attention Span: Fair  Recall:  [employer] of Knowledge: Fair  Language: Fair  Akathisia:  No  Handed:  Right  AIMS (if indicated): NA  Assets:  Communication Skills Desire for Improvement Housing Social Support Talents/Skills  ADL's:  Intact  Cognition: WNL  Sleep:  Fair   Screenings:   Assessment and Plan: Tomeshia is a 67 year old African-American female who has a recent onset history of depressive symptoms, history of hypertension, diabetes mellitus, hyperlipidemia, vitamin B12 and vitamin D deficiency, presented to the clinic today for a follow-up visit.  Patient does have a recent psychosocial stressor of the death of her brother.  Patient however does have good social support from her sister.  Patient is currently tolerating her medications well.  Plan as noted below.  Plan MDD Continue Paxil 20 mg p.o. daily Increase gabapentin to 100 mg twice a day.  Rule out some somatic symptom disorder She is already on vitamin B12,  vitamin D replacement. She does struggle with seasonal allergies which can also contribute to headaches. Blood pressure is elevated today .  Discussed with her to follow-up with her primary medical doctor with the blood pressure log.   She will continue psychotherapy with Ms. Nolon Rod.  Follow up in clinic in 8 weeks or sooner if needed.  More than 50 % of the time was spent for psychoeducation and supportive psychotherapy and care coordination.  This note was generated in part or whole with voice recognition software. Voice recognition is usually quite accurate but there are transcription errors that can and very often do occur. I apologize for any typographical errors that were not detected and corrected.       Jomarie Longs, MD 11/26/2017, 5:07 PM

## 2017-12-18 ENCOUNTER — Other Ambulatory Visit: Payer: Self-pay | Admitting: Internal Medicine

## 2017-12-18 DIAGNOSIS — M170 Bilateral primary osteoarthritis of knee: Secondary | ICD-10-CM | POA: Diagnosis not present

## 2017-12-18 DIAGNOSIS — J301 Allergic rhinitis due to pollen: Secondary | ICD-10-CM | POA: Diagnosis not present

## 2017-12-18 DIAGNOSIS — E559 Vitamin D deficiency, unspecified: Secondary | ICD-10-CM | POA: Diagnosis not present

## 2017-12-18 DIAGNOSIS — E119 Type 2 diabetes mellitus without complications: Secondary | ICD-10-CM | POA: Diagnosis not present

## 2017-12-18 DIAGNOSIS — E538 Deficiency of other specified B group vitamins: Secondary | ICD-10-CM | POA: Diagnosis not present

## 2017-12-18 DIAGNOSIS — Z79899 Other long term (current) drug therapy: Secondary | ICD-10-CM | POA: Diagnosis not present

## 2017-12-18 DIAGNOSIS — Z1231 Encounter for screening mammogram for malignant neoplasm of breast: Secondary | ICD-10-CM

## 2017-12-18 DIAGNOSIS — Z0000 Encounter for general adult medical examination without abnormal findings: Secondary | ICD-10-CM | POA: Diagnosis not present

## 2017-12-18 DIAGNOSIS — I10 Essential (primary) hypertension: Secondary | ICD-10-CM | POA: Diagnosis not present

## 2017-12-18 DIAGNOSIS — E7800 Pure hypercholesterolemia, unspecified: Secondary | ICD-10-CM | POA: Diagnosis not present

## 2017-12-19 ENCOUNTER — Ambulatory Visit: Payer: PPO | Admitting: Licensed Clinical Social Worker

## 2018-01-04 ENCOUNTER — Ambulatory Visit
Admission: RE | Admit: 2018-01-04 | Discharge: 2018-01-04 | Disposition: A | Discharge status: Home / Self Care | Payer: PPO | Source: Ambulatory Visit | Attending: Internal Medicine | Admitting: Internal Medicine

## 2018-01-04 DIAGNOSIS — Z1231 Encounter for screening mammogram for malignant neoplasm of breast: Secondary | ICD-10-CM | POA: Insufficient documentation

## 2018-01-10 ENCOUNTER — Other Ambulatory Visit: Payer: Self-pay

## 2018-01-10 ENCOUNTER — Ambulatory Visit (INDEPENDENT_AMBULATORY_CARE_PROVIDER_SITE_OTHER): Payer: PPO | Admitting: Psychiatry

## 2018-01-10 ENCOUNTER — Encounter: Payer: Self-pay | Admitting: Psychiatry

## 2018-01-10 VITALS — BP 161/76 | HR 77 | Temp 97.6°F | Wt 232.2 lb

## 2018-01-10 DIAGNOSIS — F320 Major depressive disorder, single episode, mild: Secondary | ICD-10-CM

## 2018-01-10 DIAGNOSIS — F419 Anxiety disorder, unspecified: Secondary | ICD-10-CM

## 2018-01-10 MED ORDER — PAROXETINE HCL 20 MG PO TABS: 20.0000 mg | ORAL_TABLET | Freq: Every day | ORAL | 1 refills | Status: DC

## 2018-01-10 NOTE — Progress Notes (Addendum)
BH MD OP Progress Note  01/10/2018 2:33 PM Hayley Carter  MRN:  [id number]  Chief Complaint: ' I am here for follow up.' Chief Complaint    Follow-up; Medication Refill     HPI: Hayley Carter is a 67 year old African-American female, single, retired, lives in [place], has a history of recent onset depression, headaches, history of hypertension, hyperlipidemia, diabetes mellitus, presented to the clinic today for a follow-up visit.  Patient today reports she is currently doing well on the current medication regimen.  She is tolerating the Paxil and the gabapentin well.  She reports she does not have any side effects to this medication.  She reports she continues to have some headaches however they are improving.  Her headaches does not last too long.  She reports it is mostly triggered by anxiety provoking situations.  She however reports she is happy about her progress.  She continues to be in psychotherapy with Ms. Peacock.  She denies any suicidality or perceptual disturbances. Visit Diagnosis:    ICD-10-CM   1. MDD (major depressive disorder), single episode, mild (HCC) F32.0   2. Anxiety disorder, unspecified type F41.9     Past Psychiatric History: I have reviewed past psychiatric history from my progress note on 11/26/2017.  Past Medical History:  Past Medical History:  Diagnosis Date  . Anxiety   . Arthritis   . Depression   . Diabetes mellitus without complication (HCC)   . Diabetes mellitus, type II (HCC)   . Hypercholesterolemia   . Hypertension     Past Surgical History:  Procedure Laterality Date  . COLONOSCOPY    . COLONOSCOPY N/A 06/19/2016   Procedure: COLONOSCOPY;  Surgeon: Christena Deem, MD;  Location: [facility] ENDOSCOPY;  Service: Endoscopy;  Laterality: N/A;    Family Psychiatric History: Have reviewed family psychiatric history from my progress note on 11/26/2017  Family History:  Family History  Problem Relation Age of Onset  . Colon cancer Mother    . Hypertension Mother   . Stroke Father   . Heart attack Father   . Ulcers Father   . Hypertension Father   . Migraines Sister   . Hypertension Sister   . Hypertension Brother   . Aneurysm Paternal Grandmother   . Prostate cancer Paternal Uncle   . Anxiety disorder Other   . Breast cancer Neg Hx    Substance abuse history: Denies  Social History: She is single.  She lives in [place].  She went up to 12th grade and also did some computer courses.  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  She denies having children.  She does have social support system from her sister. Social History   Socioeconomic History  . Marital status: Single    Spouse name: Not on file  . Number of children: 0  . Years of education: Not on file  . Highest education level: Some college, no degree  Occupational History    Comment: retired  Engineer, production  . Financial resource strain: Not hard at all  . Food insecurity:    Worry: Never true    Inability: Never true  . Transportation needs:    Medical: No    Non-medical: No  Tobacco Use  . Smoking status: Former Smoker    Packs/day: 0.25    Years: 25.00    Pack years: 6.25    Types: Cigarettes    Last attempt to quit: 09/04/1998    Years since quitting: 19.3  .  Smokeless tobacco: Never Used  Substance and Sexual Activity  . Alcohol use: No  . Drug use: No  . Sexual activity: Never  Lifestyle  . Physical activity:    Days per week: 1 day    Minutes per session: 30 min  . Stress: Not at all  Relationships  . Social connections:    Talks on phone: Twice a week    Gets together: Never    Attends religious service: Never    Active member of club or organization: No    Attends meetings of clubs or organizations: Never    Relationship status: Never married  Other Topics Concern  . Not on file  Social History Narrative  . Not on file    Allergies:  Allergies  Allergen Reactions  . Lisinopril Other (See Comments)     Headaches    Metabolic Disorder Labs: No results found for: HGBA1C, MPG No results found for: PROLACTIN No results found for: CHOL, TRIG, HDL, CHOLHDL, VLDL, LDLCALC No results found for: TSH  Therapeutic Level Labs: No results found for: LITHIUM No results found for: VALPROATE No components found for:  CBMZ  Current Medications: Current Outpatient Medications  Medication Sig Dispense Refill  . atenolol (TENORMIN) 25 MG tablet Take by mouth daily.    . Calcium Carb-Cholecalciferol (CALCIUM CARBONATE-VITAMIN D3 PO) Take 1 tablet by mouth daily.    Marland Kitchen gabapentin (NEURONTIN) 100 MG capsule Take 1 capsule (100 mg total) by mouth 2 (two) times daily. 60 capsule 2  . loratadine (CLARITIN) 10 MG tablet Take 10 mg by mouth daily.    . metFORMIN (GLUCOPHAGE-XR) 500 MG 24 hr tablet Take 500 mg by mouth daily with breakfast.    . Multiple Vitamin (MULTIVITAMIN) tablet Take 1 tablet by mouth daily.    Marland Kitchen PARoxetine (PAXIL) 20 MG tablet Take 1 tablet (20 mg total) by mouth daily. 90 tablet 1  . potassium chloride (K-DUR) 10 MEQ tablet Take 10 mEq by mouth daily.    . simvastatin (ZOCOR) 40 MG tablet Take 40 mg by mouth daily.    Marland Kitchen triamcinolone (NASACORT AQ) 55 MCG/ACT AERO nasal inhaler Place 2 sprays into the nose daily.    Marland Kitchen triamterene-hydrochlorothiazide (MAXZIDE-25) 37.5-25 MG tablet Take 1 tablet by mouth daily.     No current facility-administered medications for this visit.      Musculoskeletal: Strength & Muscle Tone: within normal limits Gait & Station: normal Patient leans: N/A  Psychiatric Specialty Exam: Review of Systems  HENT:       Headaches  Psychiatric/Behavioral: The patient is nervous/anxious.   All other systems reviewed and are negative.   Blood pressure (!) 161/76, pulse 77, temperature 97.6 F (36.4 C), temperature source Oral, weight 232 lb 3.2 oz (105.3 kg).Body mass index is 38.64 kg/m.  General Appearance: Casual  Eye Contact:  Fair  Speech:  Clear  and Coherent  Volume:  Normal  Mood:  Anxious  Affect:  Congruent  Thought Process:  Goal Directed and Descriptions of Associations: Intact  Orientation:  Full (Time, Place, and Person)  Thought Content: Logical   Suicidal Thoughts:  No  Homicidal Thoughts:  No  Memory:  Immediate;   Fair Recent;   Fair Remote;   Fair  Judgement:  Fair  Insight:  Fair  Psychomotor Activity:  Normal  Concentration:  Concentration: Fair and Attention Span: Fair  Recall:  [employer] of Knowledge: Fair  Language: Fair  Akathisia:  No  Handed:  Right  AIMS (  if indicated): na  Assets:  Communication Skills Desire for Improvement Housing Talents/Skills  ADL's:  Intact  Cognition: WNL  Sleep:  Fair   Screenings:PHQ 9,GAD 7   Assessment and Plan: Hayley Carter is a 67 year old African-American female who has a history of depressive symptoms, history of hypertension, diabetes mellitus, hyperlipidemia, vitamin B12, vitamin D deficiency, presented to the clinic today for a follow-up visit.  Patient has psychosocial stressor of the death of her brother recently.  She continues to have good social support from her sister.  Patient reports she is progressing on the current medication regimen.  Plan as noted below.  Plan MDD Continue Paxil 20 mg p.o. daily Continue gabapentin 100 mg p.o. twice daily phq 9 =1  Rule out somatic symptom disorder We will continue to monitor her closely.  She is making progress.  Unknown if her headaches are due to seasonal allergies or her blood pressure being elevated.  She will continue to keep track of her blood pressure and follow-up with her primary medical doctor. Gad 7 -7  She will continue psychotherapy with Ms. Peacock.  Follow-up in clinic in 8 weeks or sooner if needed.  More than 50 % of the time was spent for psychoeducation and supportive psychotherapy and care coordination.  This note was generated in part or whole with voice recognition software. Voice  recognition is usually quite accurate but there are transcription errors that can and very often do occur. I apologize for any typographical errors that were not detected and corrected.      Jomarie Longs, MD 01/10/2018, 2:33 PM

## 2018-01-30 ENCOUNTER — Ambulatory Visit (INDEPENDENT_AMBULATORY_CARE_PROVIDER_SITE_OTHER): Payer: PPO | Admitting: Licensed Clinical Social Worker

## 2018-01-30 DIAGNOSIS — F320 Major depressive disorder, single episode, mild: Secondary | ICD-10-CM

## 2018-01-30 DIAGNOSIS — F419 Anxiety disorder, unspecified: Secondary | ICD-10-CM

## 2018-03-05 ENCOUNTER — Other Ambulatory Visit: Payer: Self-pay

## 2018-03-05 ENCOUNTER — Ambulatory Visit (INDEPENDENT_AMBULATORY_CARE_PROVIDER_SITE_OTHER): Payer: PPO | Admitting: Psychiatry

## 2018-03-05 ENCOUNTER — Encounter: Payer: Self-pay | Admitting: Psychiatry

## 2018-03-05 VITALS — BP 149/71 | HR 73 | Temp 97.9°F | Wt 229.0 lb

## 2018-03-05 DIAGNOSIS — F419 Anxiety disorder, unspecified: Secondary | ICD-10-CM

## 2018-03-05 DIAGNOSIS — F320 Major depressive disorder, single episode, mild: Secondary | ICD-10-CM

## 2018-03-05 MED ORDER — PAROXETINE HCL 20 MG PO TABS: 30.0000 mg | ORAL_TABLET | Freq: Every day | ORAL | 0 refills | Status: DC

## 2018-03-05 MED ORDER — GABAPENTIN 100 MG PO CAPS: 100.0000 mg | ORAL_CAPSULE | Freq: Two times a day (BID) | ORAL | 1 refills | Status: DC

## 2018-03-05 NOTE — Progress Notes (Signed)
   THERAPIST PROGRESS NOTE  Session Time: 37mn  Participation Level: Active  Behavioral Response: CasualAlertEuthymic  Type of Therapy: Individual Therapy  Treatment Goals addressed: Coping  Interventions: CBT and Motivational Interviewing  Summary: Hayley KEGGis a 67y.o. female who presents with continued symptoms of her diagnosis.  Therapist met with Patient in an initial therapy session to assess current mood and to build rapport. Therapist engaged Patient in discussion about her life and what is going well for her. Therapist provided support for Patient as she shared details about her life, her current stressors, mood, coping skills, her past, and her children. Therapist prompted Patient to discuss her support system and ways that she manages her daily stress, anger, and frustrations.  LCSW discussed what psychotherapy is and is not and the importance of the therapeutic relationship to include open and honest communication between client and therapist and building trust.  Reviewed advantages and disadvantages of the therapeutic process and limitations to the therapeutic relationship including LCSW's role in maintaining the safety of the client, others and those in client's care.   Suicidal/Homicidal: No  Plan: Return again in 2 weeks.  Diagnosis: Axis I: Generalized Anxiety Disorder and Depression    Axis II: No diagnosis    NLubertha South LCSW 01/30/2018

## 2018-03-05 NOTE — Progress Notes (Signed)
BH MD OP Progress Note  03/05/2018 2:14 PM Hayley Carter  MRN:  [id number]  Chief Complaint: ' I am here for follow up." Chief Complaint    Follow-up; Medication Refill     HPI: Hayley Carter is a 67 year old African-American female, single, retired, lives in [place], has a history of recent onset depression, headaches, history of hypertension, hyperlipidemia, diabetes mellitus, presented to the clinic today for a follow-up visit.  Patient today reports she continues to have some on and off anxiety spells.  She reports she continues to hence have some headaches on and off.  She reports most of her headaches are triggered by anxiety provoking situations.  She reports she is tolerating her Paxil well.  She continues to take the gabapentin as prescribed.  Patient denies any suicidality.  Patient denies any perceptual disturbances.  Patient continues to be in psychotherapy with Ms. Peacock. Visit Diagnosis:    ICD-10-CM   1. MDD (major depressive disorder), single episode, mild (HCC) F32.0 gabapentin (NEURONTIN) 100 MG capsule  2. Anxiety disorder, unspecified type F41.9     Past Psychiatric History: Reviewed past psychiatric history from my progress note on 11/26/2017  Past Medical History:  Past Medical History:  Diagnosis Date  . Anxiety   . Arthritis   . Depression   . Diabetes mellitus without complication (HCC)   . Diabetes mellitus, type II (HCC)   . Hypercholesterolemia   . Hypertension     Past Surgical History:  Procedure Laterality Date  . COLONOSCOPY    . COLONOSCOPY N/A 06/19/2016   Procedure: COLONOSCOPY;  Surgeon: Christena Deem, MD;  Location: [facility] ENDOSCOPY;  Service: Endoscopy;  Laterality: N/A;    Family Psychiatric History: Reviewed family psychiatric history from my progress note on 11/26/2017.  Family History:  Family History  Problem Relation Age of Onset  . Colon cancer Mother   . Hypertension Mother   . Stroke Father   . Heart attack Father    . Ulcers Father   . Hypertension Father   . Migraines Sister   . Hypertension Sister   . Hypertension Brother   . Aneurysm Paternal Grandmother   . Prostate cancer Paternal Uncle   . Anxiety disorder Other   . Breast cancer Neg Hx     Social History: Reviewed social history from my progress note on 11/26/2017. Social History   Socioeconomic History  . Marital status: Single    Spouse name: Not on file  . Number of children: 0  . Years of education: Not on file  . Highest education level: Some college, no degree  Occupational History    Comment: retired  Engineer, production  . Financial resource strain: Not hard at all  . Food insecurity:    Worry: Never true    Inability: Never true  . Transportation needs:    Medical: No    Non-medical: No  Tobacco Use  . Smoking status: Former Smoker    Packs/day: 0.25    Years: 25.00    Pack years: 6.25    Types: Cigarettes    Last attempt to quit: 09/04/1998    Years since quitting: 19.5  . Smokeless tobacco: Never Used  Substance and Sexual Activity  . Alcohol use: No  . Drug use: No  . Sexual activity: Never  Lifestyle  . Physical activity:    Days per week: 1 day    Minutes per session: 30 min  . Stress: Not at all  Relationships  . Social connections:  Talks on phone: Twice a week    Gets together: Never    Attends religious service: Never    Active member of club or organization: No    Attends meetings of clubs or organizations: Never    Relationship status: Never married  Other Topics Concern  . Not on file  Social History Narrative  . Not on file    Allergies:  Allergies  Allergen Reactions  . Lisinopril Other (See Comments)    Headaches    Metabolic Disorder Labs: No results found for: HGBA1C, MPG No results found for: PROLACTIN No results found for: CHOL, TRIG, HDL, CHOLHDL, VLDL, LDLCALC No results found for: TSH  Therapeutic Level Labs: No results found for: LITHIUM No results found for:  VALPROATE No components found for:  CBMZ  Current Medications: Current Outpatient Medications  Medication Sig Dispense Refill  . atenolol (TENORMIN) 25 MG tablet Take by mouth daily.    . Calcium Carb-Cholecalciferol (CALCIUM CARBONATE-VITAMIN D3 PO) Take 1 tablet by mouth daily.    Marland Kitchen. gabapentin (NEURONTIN) 100 MG capsule Take 1 capsule (100 mg total) by mouth 2 (two) times daily. 180 capsule 1  . loratadine (CLARITIN) 10 MG tablet Take 10 mg by mouth daily.    . metFORMIN (GLUCOPHAGE-XR) 500 MG 24 hr tablet Take 500 mg by mouth daily with breakfast.    . Multiple Vitamin (MULTIVITAMIN) tablet Take 1 tablet by mouth daily.    Marland Kitchen. PARoxetine (PAXIL) 20 MG tablet Take 1.5 tablets (30 mg total) by mouth daily. 135 tablet 0  . potassium chloride (K-DUR) 10 MEQ tablet Take 10 mEq by mouth daily.    . simvastatin (ZOCOR) 40 MG tablet Take 40 mg by mouth daily.    Marland Kitchen. triamcinolone (NASACORT AQ) 55 MCG/ACT AERO nasal inhaler Place 2 sprays into the nose daily.    Marland Kitchen. triamterene-hydrochlorothiazide (MAXZIDE-25) 37.5-25 MG tablet Take 1 tablet by mouth daily.     No current facility-administered medications for this visit.      Musculoskeletal: Strength & Muscle Tone: within normal limits Gait & Station: normal Patient leans: N/A  Psychiatric Specialty Exam: Review of Systems  Psychiatric/Behavioral: The patient is nervous/anxious.   All other systems reviewed and are negative.   Blood pressure (!) 149/71, pulse 73, temperature 97.9 F (36.6 C), temperature source Oral, weight 229 lb (103.9 kg).Body mass index is 38.11 kg/m.  General Appearance: Casual  Eye Contact:  Fair  Speech:  Clear and Coherent  Volume:  Normal  Mood:  Anxious  Affect:  Congruent  Thought Process:  Goal Directed and Descriptions of Associations: Intact  Orientation:  Full (Time, Place, and Person)  Thought Content: Logical   Suicidal Thoughts:  No  Homicidal Thoughts:  No  Memory:  Immediate;   Fair Recent;    Fair Remote;   Fair  Judgement:  Fair  Insight:  Fair  Psychomotor Activity:  Normal  Concentration:  Concentration: Fair and Attention Span: Fair  Recall:  [employer]  Fund of Knowledge: Fair  Language: Fair  Akathisia:  No  Handed:  Right  AIMS (if indicated): na  Assets:  Communication Skills Desire for Improvement Social Support  ADL's:  Intact  Cognition: WNL  Sleep:  Fair   Screenings:   Assessment and Plan: Elease Hashimotoatricia is a 67 year old African-American female who has a history of depressive symptoms, history of hypertension, diabetes mellitus, hyperlipidemia, vitamin B12, vitamin D deficiency, presented to the clinic today for a follow-up visit.  Patient continues to have psychosocial stressors  of the death of her brother.  Patient reports some recent anxiety symptoms as well as headaches.  Discussed medication readjustment.  Plan as noted below.  Plan MDD Increase Paxil to 30 mg p.o. daily Continue gabapentin 100 mg p.o. twice daily.    Rule out somatic symptom disorder. We will continue to monitor her closely.  Unknown if her headaches are due to seasonal allergies or her blood pressure being elevated.  It is likely also multifactorial.  She continues to follow-up with her primary medical doctor for her elevated blood pressure.  She will continue psychotherapy with Ms. Peacock.  Follow-up in clinic in 6-8 weeks.  More than 50 % of the time was spent for psychoeducation and supportive psychotherapy and care coordination.  This note was generated in part or whole with voice recognition software. Voice recognition is usually quite accurate but there are transcription errors that can and very often do occur. I apologize for any typographical errors that were not detected and corrected.       Jomarie Longs, MD 03/06/2018, 5:06 PM

## 2018-03-06 ENCOUNTER — Encounter: Payer: Self-pay | Admitting: Psychiatry

## 2018-03-14 ENCOUNTER — Ambulatory Visit: Payer: PPO | Admitting: Licensed Clinical Social Worker

## 2018-04-16 ENCOUNTER — Ambulatory Visit: Payer: PPO | Admitting: Licensed Clinical Social Worker

## 2018-04-30 ENCOUNTER — Encounter: Payer: Self-pay | Admitting: Psychiatry

## 2018-04-30 ENCOUNTER — Ambulatory Visit (INDEPENDENT_AMBULATORY_CARE_PROVIDER_SITE_OTHER): Payer: PPO | Admitting: Psychiatry

## 2018-04-30 ENCOUNTER — Other Ambulatory Visit: Payer: Self-pay

## 2018-04-30 VITALS — BP 152/75 | HR 79 | Temp 98.3°F | Wt 230.8 lb

## 2018-04-30 DIAGNOSIS — F321 Major depressive disorder, single episode, moderate: Secondary | ICD-10-CM | POA: Diagnosis not present

## 2018-04-30 DIAGNOSIS — R030 Elevated blood-pressure reading, without diagnosis of hypertension: Secondary | ICD-10-CM | POA: Diagnosis not present

## 2018-04-30 MED ORDER — GABAPENTIN 100 MG PO CAPS: 200.0000 mg | ORAL_CAPSULE | Freq: Two times a day (BID) | ORAL | 1 refills | Status: DC

## 2018-04-30 MED ORDER — PAROXETINE HCL 20 MG PO TABS: 30.0000 mg | ORAL_TABLET | Freq: Every day | ORAL | 0 refills | Status: DC

## 2018-04-30 NOTE — Progress Notes (Signed)
BH MD OP Progress Note  04/30/2018 2:42 PM Merril Abbeatricia A Achey  MRN:  [id number]  Chief Complaint: ' I am here for follow up." Chief Complaint    Follow-up; Medication Refill     HPI: Hayley Carter is a 67 year old African-American female, single, retired, lives in [place], has a history of recent onset depression, headaches, history of hypertension, hyperlipidemia, diabetes mellitus, presented to the clinic today for a follow-up visit.  Patient today reports she continues to have some anxiety symptoms on and off which is contributing to some headaches.  Patient reports her headaches are triggered mostly by anxiety provoking situations.  Patient reports the Paxil and the gabapentin combination helped her initially.  However recently she has noticed some headaches on a more frequent basis.  Patient reports she is otherwise doing okay.  She denies any suicidality.  Patient denies any perceptual disturbances.  She reports her sleep is fair.  Patient reports appetite as fair.  Patient continues to have support system from family. Visit Diagnosis:    ICD-10-CM   1. 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 Elevated blood pressure reading R03.0     Past Psychiatric History: Have reviewed past psychiatric history from my progress note on 11/26/2017  Past Medical History:  Past Medical History:  Diagnosis Date  . Anxiety   . Arthritis   . Depression   . Diabetes mellitus without complication (HCC)   . Diabetes mellitus, type II (HCC)   . Hypercholesterolemia   . Hypertension     Past Surgical History:  Procedure Laterality Date  . COLONOSCOPY    . COLONOSCOPY N/A 06/19/2016   Procedure: COLONOSCOPY;  Surgeon: Christena DeemMartin U Skulskie, MD;  [facility] END[facility]COPY;  Service: Endoscopy;  Laterality: N/A;    Family Psychiatric History: Reviewed family psychiatric history from my progress note on 11/26/2017.  Family History:  Family History   Problem Relation Age of Onset  . Colon cancer Mother   . Hypertension Mother   . Stroke Father   . Heart attack Father   . Ulcers Father   . Hypertension Father   . Migraines Sister   . Hypertension Sister   . Hypertension Brother   . Aneurysm Paternal Grandmother   . Prostate cancer Paternal Uncle   . Anxiety disorder Other   . Breast cancer Neg Hx     Social History: Reviewed social history from my progress note on 11/26/2017. Social History   Socioeconomic History  . Marital status: Single    Spouse name: Not on file  . Number of children: 0  . Years of education: Not on file  . Highest education level: Some college, no degree  Occupational History    Comment: retired  Engineer, productionocial Needs  . Financial resource strain: Not hard at all  . Food insecurity:    Worry: Never true    Inability: Never true  . Transportation needs:    Medical: No    Non-medical: No  Tobacco Use  . Smoking status: Former Smoker    Packs/day: 0.25    Years: 25.00    Pack years: 6.25    Types: Cigarettes    Last attempt to quit: 09/04/1998    Years since quitting: 19.6  . Smokeless tobacco: Never Used  Substance and Sexual Activity  . Alcohol use: No  . Drug use: No  . Sexual activity: Never  Lifestyle  . Physical activity:    Days per week: 1 day  Minutes per session: 30 min  . Stress: Not at all  Relationships  . Social connections:    Talks on phone: Twice a week    Gets together: Never    Attends religious service: Never    Active member of club or organization: No    Attends meetings of clubs or organizations: Never    Relationship status: Never married  Other Topics Concern  . Not on file  Social History Narrative  . Not on file    Allergies:  Allergies  Allergen Reactions  . Lisinopril Other (See Comments)    Headaches    Metabolic Disorder Labs: No results found for: HGBA1C, MPG No results found for: PROLACTIN No results found for: CHOL, TRIG, HDL, CHOLHDL, VLDL,  LDLCALC No results found for: TSH  Therapeutic Level Labs: No results found for: LITHIUM No results found for: VALPROATE No components found for:  CBMZ  Current Medications: Current Outpatient Medications  Medication Sig Dispense Refill  . atenolol (TENORMIN) 25 MG tablet Take by mouth daily.    . Calcium Carb-Cholecalciferol (CALCIUM CARBONATE-VITAMIN D3 PO) Take 1 tablet by mouth daily.    Marland Kitchen loratadine (CLARITIN) 10 MG tablet Take 10 mg by mouth daily.    . metFORMIN (GLUCOPHAGE-XR) 500 MG 24 hr tablet Take 500 mg by mouth daily with breakfast.    . Multiple Vitamin (MULTIVITAMIN) tablet Take 1 tablet by mouth daily.    Marland Kitchen PARoxetine (PAXIL) 20 MG tablet Take 1.5 tablets (30 mg total) by mouth daily. 135 tablet 0  . potassium chloride (K-DUR) 10 MEQ tablet Take 10 mEq by mouth daily.    . simvastatin (ZOCOR) 40 MG tablet Take 40 mg by mouth daily.    Marland Kitchen triamcinolone (NASACORT AQ) 55 MCG/ACT AERO nasal inhaler Place 2 sprays into the nose daily.    Marland Kitchen triamterene-hydrochlorothiazide (MAXZIDE-25) 37.5-25 MG tablet Take 1 tablet by mouth daily.    Marland Kitchen gabapentin (NEURONTIN) 100 MG capsule Take 2 capsules (200 mg total) by mouth 2 (two) times daily. 360 capsule 1   No current facility-administered medications for this visit.      Musculoskeletal: Strength & Muscle Tone: within normal limits Gait & Station: normal Patient leans: N/A  Psychiatric Specialty Exam: Review of Systems  Psychiatric/Behavioral: The patient is nervous/anxious.   All other systems reviewed and are negative.   Blood pressure (!) 152/75, pulse 79, temperature 98.3 F (36.8 C), temperature source Oral, weight 230 lb 12.8 oz (104.7 kg).Body mass index is 38.41 kg/m.  General Appearance: Casual  Eye Contact:  Fair  Speech:  Normal Rate  Volume:  Normal  Mood:  Anxious  Affect:  Congruent  Thought Process:  Goal Directed and Descriptions of Associations: Intact  Orientation:  Full (Time, Place, and Person)   Thought Content: Logical   Suicidal Thoughts:  No  Homicidal Thoughts:  No  Memory:  Immediate;   Fair Recent;   Fair Remote;   Fair  Judgement:  Fair  Insight:  Fair  Psychomotor Activity:  Normal  Concentration:  Concentration: Fair and Attention Span: Fair  Recall:  [employer] of Knowledge: Fair  Language: Fair  Akathisia:  No  Handed:  Right  AIMS (if indicated): na  Assets:  Communication Skills Desire for Improvement Social Support  ADL's:  Intact  Cognition: WNL  Sleep:  Fair   Screenings:   Assessment and Plan: Breuna is a 67 year old African-American female who has a history of depressive symptoms, history of hypertension, diabetes mellitus, hyperlipidemia,  vitamin B12, vitamin D deficiency, presented to the clinic today for a follow-up visit.  Patient continues to have anxiety symptoms as well as headaches related to it.  We will continue to make medication readjustment.  Plan as noted below.  Plan MDD Continue Paxil 30 mg p.o. daily Increase gabapentin to 200 mg p.o. twice daily.  Rule out somatic symptom disorder We will continue to monitor her closely.  Patient with headaches likely due to seasonal allergies, her blood pressure being unstable as well as anxiety symptoms.  Patient will continue to follow-up with her primary medical doctor for management of her other health problems.  For elevated blood pressure reading Patient continues to have elevated blood pressure reading today, discussed with patient to monitor her blood pressure closely and reach out to her PMD for management.  Patient will continue psychotherapy with Ms. Peacock.  Follow-up in clinic in 1 month or sooner if needed.  More than 50 % of the time was spent for psychoeducation and supportive psychotherapy and care coordination.  This note was generated in part or whole with voice recognition software. Voice recognition is usually quite accurate but there are transcription errors that can  and very often do occur. I apologize for any typographical errors that were not detected and corrected.       Jomarie Longs, MD 04/30/2018, 2:42 PM

## 2018-05-13 ENCOUNTER — Ambulatory Visit (INDEPENDENT_AMBULATORY_CARE_PROVIDER_SITE_OTHER): Payer: PPO | Admitting: Licensed Clinical Social Worker

## 2018-05-13 DIAGNOSIS — F321 Major depressive disorder, single episode, moderate: Secondary | ICD-10-CM | POA: Diagnosis not present

## 2018-05-13 NOTE — Progress Notes (Signed)
   THERAPIST PROGRESS NOTE  Session Time: 79mn  Participation Level: Active  Behavioral Response: CasualAlertAnxious  Type of Therapy: Individual Therapy  Treatment Goals addressed: Anxiety  Interventions: Solution Focused  Summary: Hayley BUSHARDis a 67y.o. female who presents with a reduction in symptoms.  Therapist met with Patient in an outpatient setting to assess current mood and assist with making progress towards goals through the use of therapeutic intervention. Therapist did a brief mood check, assessing anger, fear, disgust, excitement, happiness, and sadness.  Patient reports a pleasant mood.  Therapist assisted Patient with processing her thoughts.  Therapist discussed with Patient her anxious distress.  Patient states that she has small/minor headaches several times per day.  Assessed for anxiety and discovered that her headaches may be linked to anxious mood.  Assisted with positive self talk and journaling her headaches and her mood for the day to track symptoms.  Provided Patient with a mood diary and a headache tracker.     Suicidal/Homicidal: No  Plan: Return again in 4 weeks.  Diagnosis: Axis I: Depression    Axis II: No diagnosis    NLubertha South LCSW 05/13/2018

## 2018-05-30 ENCOUNTER — Ambulatory Visit (INDEPENDENT_AMBULATORY_CARE_PROVIDER_SITE_OTHER): Payer: PPO | Admitting: Psychiatry

## 2018-05-30 ENCOUNTER — Other Ambulatory Visit: Payer: Self-pay

## 2018-05-30 ENCOUNTER — Encounter: Payer: Self-pay | Admitting: Psychiatry

## 2018-05-30 VITALS — BP 163/74 | HR 69 | Temp 97.5°F | Wt 224.6 lb

## 2018-05-30 DIAGNOSIS — F321 Major depressive disorder, single episode, moderate: Secondary | ICD-10-CM | POA: Diagnosis not present

## 2018-05-30 DIAGNOSIS — R030 Elevated blood-pressure reading, without diagnosis of hypertension: Secondary | ICD-10-CM

## 2018-05-30 NOTE — Progress Notes (Signed)
BH MD OP Progress Note  05/30/2018 4:19 PM Hayley Carter  MRN:  [id number]  Chief Complaint: ' I am here for follow up." Chief Complaint    Follow-up; Medication Refill     HPI: Hayley Carter is a 67 year old African-American female, single, retired, lives in [place], has a history of recent onset depression, headaches, history of hypertension, hyperlipidemia, diabetes mellitus, presented to the clinic today for a follow-up visit.   Patient today reports she has noticed some improvement in her mood symptoms.  She is currently on Paxil 30 mg and tolerating it well.  She reports her headaches as improved and less frequent now.  Patient continues to be on gabapentin however reports she continues to be on the previous dosage and did not go up since she never went to pick up her prescription from the pharmacy.  Discussed with patient that a new increased dosage was sent to her pharmacy.  Patient reports she will check with her pharmacy today.  Patient denies any suicidality or homicidality.  Patient reports she is working with Hayley Carter her therapist which has been helpful.  She is trying to keep her thoughts more positive and has been using positive affirmation daily which has been helpful.  Continues to have elevated blood pressure reading, she reports she has reached out to her primary medical doctor about the same.   Visit Diagnosis:    ICD-10-CM   1. 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 Elevated blood pressure reading R03.0     Past Psychiatric History: Have reviewed past psychiatric history from my progress note on 11/26/2017  Past Medical History:  Past Medical History:  Diagnosis Date  . Anxiety   . Arthritis   . Depression   . Diabetes mellitus without complication (HCC)   . Diabetes mellitus, type II (HCC)   . Hypercholesterolemia   . Hypertension     Past Surgical History:  Procedure Laterality Date  . COLONOSCOPY     . COLONOSCOPY N/A 06/19/2016   Procedure: COLONOSCOPY;  Surgeon: Christena Deem, MD;  Location: [facility] ENDOSCOPY;  Service: Endoscopy;  Laterality: N/A;    Family Psychiatric History: Reviewed family psychiatric history from my progress note on 11/26/2017  Family History:  Family History  Problem Relation Age of Onset  . Colon cancer Mother   . Hypertension Mother   . Stroke Father   . Heart attack Father   . Ulcers Father   . Hypertension Father   . Migraines Sister   . Hypertension Sister   . Hypertension Brother   . Aneurysm Paternal Grandmother   . Prostate cancer Paternal Uncle   . Anxiety disorder Other   . Breast cancer Neg Hx     Social History: I have reviewed social history from my progress note on 11/26/2017 Social History   Socioeconomic History  . Marital status: Single    Spouse name: Not on file  . Number of children: 0  . Years of education: Not on file  . Highest education level: Some college, no degree  Occupational History    Comment: retired  Engineer, production  . Financial resource strain: Not hard at all  . Food insecurity:    Worry: Never true    Inability: Never true  . Transportation needs:    Medical: No    Non-medical: No  Tobacco Use  . Smoking status: Former Smoker    Packs/day: 0.25    Years: 25.00    Pack  years: 6.25    Types: Cigarettes    Last attempt to quit: 09/04/1998    Years since quitting: 19.7  . Smokeless tobacco: Never Used  Substance and Sexual Activity  . Alcohol use: No  . Drug use: No  . Sexual activity: Never  Lifestyle  . Physical activity:    Days per week: 1 day    Minutes per session: 30 min  . Stress: Not at all  Relationships  . Social connections:    Talks on phone: Twice a week    Gets together: Never    Attends religious service: Never    Active member of club or organization: No    Attends meetings of clubs or organizations: Never    Relationship status: Never married  Other Topics Concern  . Not  on file  Social History Narrative  . Not on file    Allergies:  Allergies  Allergen Reactions  . Lisinopril Other (See Comments)    Headaches    Metabolic Disorder Labs: No results found for: HGBA1C, MPG No results found for: PROLACTIN No results found for: CHOL, TRIG, HDL, CHOLHDL, VLDL, LDLCALC No results found for: TSH  Therapeutic Level Labs: No results found for: LITHIUM No results found for: VALPROATE No components found for:  CBMZ  Current Medications: Current Outpatient Medications  Medication Sig Dispense Refill  . atenolol (TENORMIN) 25 MG tablet Take by mouth daily.    . Calcium Carb-Cholecalciferol (CALCIUM CARBONATE-VITAMIN D3 PO) Take 1 tablet by mouth daily.    Marland Kitchen gabapentin (NEURONTIN) 100 MG capsule Take 2 capsules (200 mg total) by mouth 2 (two) times daily. 360 capsule 1  . loratadine (CLARITIN) 10 MG tablet Take 10 mg by mouth daily.    . metFORMIN (GLUCOPHAGE-XR) 500 MG 24 hr tablet Take 500 mg by mouth daily with breakfast.    . Multiple Vitamin (MULTIVITAMIN) tablet Take 1 tablet by mouth daily.    Marland Kitchen PARoxetine (PAXIL) 20 MG tablet Take 1.5 tablets (30 mg total) by mouth daily. 135 tablet 0  . potassium chloride (K-DUR) 10 MEQ tablet Take 10 mEq by mouth daily.    . simvastatin (ZOCOR) 40 MG tablet Take 40 mg by mouth daily.    Marland Kitchen triamcinolone (NASACORT AQ) 55 MCG/ACT AERO nasal inhaler Place 2 sprays into the nose daily.    Marland Kitchen triamterene-hydrochlorothiazide (MAXZIDE-25) 37.5-25 MG tablet Take 1 tablet by mouth daily.     No current facility-administered medications for this visit.      Musculoskeletal: Strength & Muscle Tone: within normal limits Gait & Station: normal Patient leans: N/A  Psychiatric Specialty Exam: Review of Systems  Psychiatric/Behavioral: The patient is nervous/anxious.   All other systems reviewed and are negative.   Blood pressure (!) 163/74, pulse 69, temperature (!) 97.5 F (36.4 C), temperature source Oral, weight  224 lb 9.6 oz (101.9 kg).Body mass index is 37.38 kg/m.  General Appearance: Casual  Eye Contact:  Fair  Speech:  Clear and Coherent  Volume:  Normal  Mood:  Dysphoric  Affect:  Congruent  Thought Process:  Goal Directed and Descriptions of Associations: Intact  Orientation:  Full (Time, Place, and Person)  Thought Content: Logical   Suicidal Thoughts:  No  Homicidal Thoughts:  No  Memory:  Immediate;   Fair Recent;   Fair Remote;   Fair  Judgement:  Fair  Insight:  Fair  Psychomotor Activity:  Normal  Concentration:  Concentration: Fair and Attention Span: Fair  Recall:  [employer]  of Knowledge: Fair  Language: Fair  Akathisia:  No  Handed:  Right  AIMS (if indicated): na  Assets:  Communication Skills Desire for Improvement Social Support  ADL's:  Intact  Cognition: WNL  Sleep:  Fair   Screenings:   Assessment and Plan: Tariya is a 67 year old African-American female who has a history of depressive symptoms, history of hypertension, diabetes mellitus, hyperlipidemia, vitamin B12, vitamin D deficiency, presented to the clinic today for a follow-up visit.  Patient continues to make some progress and is currently in psychotherapy with our therapist which is going well.  Plan as noted below.  Plan MDD Paxil 30 mg p.o. Daily Increase gabapentin to 200 mg p.o. twice daily.  Patient agrees to pick up a prescription from the pharmacy today.  Rule out somatic symptom disorder We will continue to monitor patient closely.  Patient with elevated blood pressure reading again today.  She reports she has already reached out to her primary medical doctor.  Patient will continue psychotherapy with Ms. Peacock.  Follow-up in clinic in 4 weeks or sooner if needed.  More than 50 % of the time was spent for psychoeducation and supportive psychotherapy and care coordination.  This note was generated in part or whole with voice recognition software. Voice recognition is usually  quite accurate but there are transcription errors that can and very often do occur. I apologize for any typographical errors that were not detected and corrected.       Jomarie Longs, MD 05/30/2018, 4:19 PM

## 2018-06-12 ENCOUNTER — Ambulatory Visit (INDEPENDENT_AMBULATORY_CARE_PROVIDER_SITE_OTHER): Payer: PPO | Admitting: Licensed Clinical Social Worker

## 2018-06-12 DIAGNOSIS — F321 Major depressive disorder, single episode, moderate: Secondary | ICD-10-CM | POA: Diagnosis not present

## 2018-06-19 DIAGNOSIS — E538 Deficiency of other specified B group vitamins: Secondary | ICD-10-CM | POA: Diagnosis not present

## 2018-06-19 DIAGNOSIS — E1159 Type 2 diabetes mellitus with other circulatory complications: Secondary | ICD-10-CM | POA: Diagnosis not present

## 2018-06-19 DIAGNOSIS — Z23 Encounter for immunization: Secondary | ICD-10-CM | POA: Diagnosis not present

## 2018-06-19 DIAGNOSIS — J301 Allergic rhinitis due to pollen: Secondary | ICD-10-CM | POA: Diagnosis not present

## 2018-06-19 DIAGNOSIS — E11630 Type 2 diabetes mellitus with periodontal disease: Secondary | ICD-10-CM | POA: Diagnosis not present

## 2018-06-19 DIAGNOSIS — E782 Mixed hyperlipidemia: Secondary | ICD-10-CM | POA: Diagnosis not present

## 2018-06-19 DIAGNOSIS — I10 Essential (primary) hypertension: Secondary | ICD-10-CM | POA: Diagnosis not present

## 2018-06-19 DIAGNOSIS — F411 Generalized anxiety disorder: Secondary | ICD-10-CM | POA: Insufficient documentation

## 2018-06-19 DIAGNOSIS — E559 Vitamin D deficiency, unspecified: Secondary | ICD-10-CM | POA: Diagnosis not present

## 2018-06-19 DIAGNOSIS — Z0000 Encounter for general adult medical examination without abnormal findings: Secondary | ICD-10-CM | POA: Diagnosis not present

## 2018-06-19 DIAGNOSIS — Z79899 Other long term (current) drug therapy: Secondary | ICD-10-CM | POA: Diagnosis not present

## 2018-06-19 DIAGNOSIS — E1169 Type 2 diabetes mellitus with other specified complication: Secondary | ICD-10-CM | POA: Diagnosis not present

## 2018-06-19 DIAGNOSIS — Z6838 Body mass index (BMI) 38.0-38.9, adult: Secondary | ICD-10-CM | POA: Diagnosis not present

## 2018-06-27 ENCOUNTER — Other Ambulatory Visit: Payer: Self-pay

## 2018-06-27 ENCOUNTER — Ambulatory Visit (INDEPENDENT_AMBULATORY_CARE_PROVIDER_SITE_OTHER): Payer: PPO | Admitting: Psychiatry

## 2018-06-27 ENCOUNTER — Encounter: Payer: Self-pay | Admitting: Psychiatry

## 2018-06-27 VITALS — BP 130/79 | HR 111 | Temp 98.2°F | Wt 226.6 lb

## 2018-06-27 DIAGNOSIS — F321 Major depressive disorder, single episode, moderate: Secondary | ICD-10-CM

## 2018-06-27 NOTE — Progress Notes (Signed)
BH MD OP Progress Note  06/27/2018 2:21 PM Hayley Carter  MRN:  [id number]  Chief Complaint: ' I am here for follow up.' Chief Complaint    Follow-up; Medication Refill     HPI: Hayley Carter is a 67 year old African-American female, single, retired, lives in [place], has a history of recent onset depression, headaches, history of hypertension, hyperlipidemia, diabetes mellitus, presented to the clinic today for a follow-up visit.  Patient today reports her primary medical doctor has made some changes with her medications.  She reports she is currently on a new medication for her high blood pressure and her blood pressure today looks much better.  She reports she is tolerating the new medication well.  Patient used to struggle with headaches previously and today reports that her headaches have improved tremendously.  Patient today reports she is compliant on her Paxil.  She reports she is not as anxious as she used to be before.  She also denies any significant depressive symptoms.  Patient reports sleep and appetite is fair.  Patient denies any other concerns today. Visit Diagnosis:    ICD-10-CM   1. 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 Anxiety   . Arthritis   . Depression   . Diabetes mellitus without complication (HCC)   . Diabetes mellitus, type II (HCC)   . Hypercholesterolemia   . Hypertension     Past Surgical History:  Procedure Laterality Date  . COLONOSCOPY    . COLONOSCOPY N/A 06/19/2016   Procedure: COLONOSCOPY;  Surgeon: Christena Deem, MD;  Location: [facility] ENDOSCOPY;  Service: Endoscopy;  Laterality: N/A;    Family Psychiatric History: Have reviewed family psychiatric history from my progress note on 11/26/2017 Family History:   Family History  Problem Relation Age of Onset  . Colon cancer Mother   . Hypertension Mother   . Stroke Father   . Heart attack Father   . Ulcers Father   . Hypertension Father   . Migraines Sister   . Hypertension Sister   . Hypertension Brother   . Aneurysm Paternal Grandmother   . Prostate cancer Paternal Uncle   . Anxiety disorder Other   . Breast cancer Neg Hx     Social History: Reviewed social history from my progress note on 11/26/2017 Social History   Socioeconomic History  . Marital status: Single    Spouse name: Not on file  . Number of children: 0  . Years of education: Not on file  . Highest education level: Some college, no degree  Occupational History    Comment: retired  Engineer, production  . Financial resource strain: Not hard at all  . Food insecurity:    Worry: Never true    Inability: Never true  . Transportation needs:    Medical: No    Non-medical: No  Tobacco Use  . Smoking status: Former Smoker    Packs/day: 0.25    Years: 25.00    Pack years: 6.25    Types: Cigarettes    Last attempt to quit: 09/04/1998    Years since quitting: 19.8  . Smokeless tobacco: Never Used  Substance and Sexual Activity  . Alcohol use: No  . Drug use: No  . Sexual activity: Never  Lifestyle  . Physical activity:  Days per week: 1 day    Minutes per session: 30 min  . Stress: Not at all  Relationships  . Social connections:    Talks on phone: Twice a week    Gets together: Never    Attends religious service: Never    Active member of club or organization: No    Attends meetings of clubs or organizations: Never    Relationship status: Never married  Other Topics Concern  . Not on file  Social History Narrative  . Not on file    Allergies:  Allergies  Allergen Reactions  . Lisinopril Other (See Comments)    Headaches    Metabolic Disorder Labs: No results found for: HGBA1C, MPG No results found for: PROLACTIN No results found for: CHOL, TRIG, HDL,  CHOLHDL, VLDL, LDLCALC No results found for: TSH  Therapeutic Level Labs: No results found for: LITHIUM No results found for: VALPROATE No components found for:  CBMZ  Current Medications: Current Outpatient Medications  Medication Sig Dispense Refill  . Calcium Carb-Cholecalciferol (CALCIUM CARBONATE-VITAMIN D3 PO) Take 1 tablet by mouth daily.    Marland Kitchen gabapentin (NEURONTIN) 100 MG capsule Take 2 capsules (200 mg total) by mouth 2 (two) times daily. 360 capsule 1  . loratadine (CLARITIN) 10 MG tablet Take 10 mg by mouth daily.    . metFORMIN (GLUCOPHAGE-XR) 500 MG 24 hr tablet Take 500 mg by mouth daily with breakfast.    . Multiple Vitamin (MULTIVITAMIN) tablet Take 1 tablet by mouth daily.    Marland Kitchen PARoxetine (PAXIL) 20 MG tablet Take 1.5 tablets (30 mg total) by mouth daily. 135 tablet 0  . simvastatin (ZOCOR) 40 MG tablet Take 40 mg by mouth daily.    Marland Kitchen triamcinolone (NASACORT AQ) 55 MCG/ACT AERO nasal inhaler Place 2 sprays into the nose daily.    . valsartan-hydrochlorothiazide (DIOVAN-HCT) 160-25 MG tablet TAKE 1 TABLET BY MOUTH EVERY DAY FOR BLOOD PRESSURE  1   No current facility-administered medications for this visit.      Musculoskeletal: Strength & Muscle Tone: within normal limits Gait & Station: normal Patient leans: N/A  Psychiatric Specialty Exam: Review of Systems  Psychiatric/Behavioral: Positive for depression (improved). The patient is nervous/anxious (improved).   All other systems reviewed and are negative.   Blood pressure 130/79, pulse (!) 111, temperature 98.2 F (36.8 C), temperature source Oral, weight 226 lb 9.6 oz (102.8 kg).Body mass index is 37.71 kg/m.  General Appearance: Casual  Eye Contact:  Fair  Speech:  Normal Rate  Volume:  Normal  Mood:  Euthymic  Affect:  Congruent  Thought Process:  Goal Directed and Descriptions of Associations: Intact  Orientation:  Full (Time, Place, and Person)  Thought Content: Logical   Suicidal Thoughts:  No   Homicidal Thoughts:  No  Memory:  Immediate;   Fair Recent;   Fair Remote;   Fair  Judgement:  Fair  Insight:  Fair  Psychomotor Activity:  Normal  Concentration:  Concentration: Fair and Attention Span: Fair  Recall:  [employer] of Knowledge: Fair  Language: Fair  Akathisia:  No  Handed:  Right  AIMS (if indicated): na  Assets:  Communication Skills Desire for Improvement Social Support  ADL's:  Intact  Cognition: WNL  Sleep:  Fair   Screenings:   Assessment and Plan: Hayley Carter is a 67 year old African-American female who has a history of depressive symptoms, history of hypertension, diabetes mellitus, hyperlipidemia, vitamin B12, vitamin D deficiency, presented to clinic today for a follow-up  visit.  Patient today reports improvement in her mood symptoms.  She is compliant on her medications.  Continue plan as noted below.  Plan MDD Paxil 30 mg p.o. daily Gabapentin 200 mg p.o. twice daily.  Patient will continue psychotherapy with Ms. Peacock.  Follow-up in clinic in 3 months or sooner if needed.  More than 50 % of the time was spent for psychoeducation and supportive psychotherapy and care coordination.  This note was generated in part or whole with voice recognition software. Voice recognition is usually quite accurate but there are transcription errors that can and very often do occur. I apologize for any typographical errors that were not detected and corrected.         Jomarie Longs, MD 06/27/2018, 2:21 PM

## 2018-07-09 NOTE — Progress Notes (Signed)
   THERAPIST PROGRESS NOTE  Session Time: 60 min  Participation Level: Active  Behavioral Response: CasualAlertEuthymic  Type of Therapy: Individual Therapy  Treatment Goals addressed: Coping  Interventions: CBT and Motivational Interviewing  Summary: Hayley Carter is a 67 y.o. female who presents with continued symptoms of her diagnosis.  Therapist met with Patient in an outpatient setting to assess current mood and assist with making progress towards goals through the use of therapeutic intervention. Therapist did a brief mood check, assessing anger, fear, disgust, excitement, happiness, and sadness. Patient reports "favorable" mood.  Patient reports that she has been busy to keep her mind occupied. Discussion of triggers and how to cope.  Patient reports that too much idle time is a trigger. Patient discussed going to the black film festival.  Therapist praised Patient for being active in the community and using family to help support her.  Discussion of cognitive restructuring.    Suicidal/Homicidal: No  Plan: Return again in 2 weeks.  Diagnosis: Axis I: Depression    Axis II: No diagnosis    Lubertha South, LCSW 06/12/2018

## 2018-07-10 ENCOUNTER — Ambulatory Visit (INDEPENDENT_AMBULATORY_CARE_PROVIDER_SITE_OTHER): Payer: PPO | Admitting: Licensed Clinical Social Worker

## 2018-07-10 DIAGNOSIS — F321 Major depressive disorder, single episode, moderate: Secondary | ICD-10-CM

## 2018-07-10 NOTE — Progress Notes (Signed)
   THERAPIST PROGRESS NOTE  Session Time: 60 min  Participation Level: Active  Behavioral Response: CasualAlertDepressed  Type of Therapy: Individual Therapy  Treatment Goals addressed: Anxiety  Interventions: CBT and Motivational Interviewing  Summary: [name] is a 67 y.o. female who presents with continued symptoms of her diagnosis.  Therapist met with Patient in an outpatient setting to assess current mood and assist with making progress towards goals through the use of therapeutic intervention. Therapist did a brief mood check, assessing anger, fear, disgust, excitement, happiness, and sadness.  Patient reports a "favorable mood with poor blood pressure."  Reports a reduction in headaches which she relates to anxiety.  Reports that she has enjoyed her past few weeks.  Discussion of coping skills.  Reports that she has used social events to cope with depression.  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 Uses walking and reading to cope.  Discussion of mindfullness.  Defined it for her and modeled the skill.  Role played the skill.    Suicidal/Homicidal: No  Plan: Return again in 2 weeks.  Diagnosis: Axis I: Depression    Axis II: No diagnosis    Lubertha South, LCSW 07/10/2018

## 2018-07-25 DIAGNOSIS — Z79899 Other long term (current) drug therapy: Secondary | ICD-10-CM | POA: Diagnosis not present

## 2018-07-25 DIAGNOSIS — I10 Essential (primary) hypertension: Secondary | ICD-10-CM | POA: Diagnosis not present

## 2018-07-25 DIAGNOSIS — E1169 Type 2 diabetes mellitus with other specified complication: Secondary | ICD-10-CM | POA: Diagnosis not present

## 2018-07-25 DIAGNOSIS — E1159 Type 2 diabetes mellitus with other circulatory complications: Secondary | ICD-10-CM | POA: Diagnosis not present

## 2018-07-25 DIAGNOSIS — E559 Vitamin D deficiency, unspecified: Secondary | ICD-10-CM | POA: Diagnosis not present

## 2018-07-25 DIAGNOSIS — E538 Deficiency of other specified B group vitamins: Secondary | ICD-10-CM | POA: Diagnosis not present

## 2018-07-25 DIAGNOSIS — Z23 Encounter for immunization: Secondary | ICD-10-CM | POA: Diagnosis not present

## 2018-08-05 ENCOUNTER — Ambulatory Visit: Payer: PPO | Admitting: Licensed Clinical Social Worker

## 2018-08-13 ENCOUNTER — Other Ambulatory Visit: Payer: Self-pay | Admitting: Psychiatry

## 2018-08-22 ENCOUNTER — Telehealth: Payer: Self-pay

## 2018-08-22 ENCOUNTER — Ambulatory Visit: Payer: PPO | Admitting: Licensed Clinical Social Worker

## 2018-08-22 MED ORDER — PAROXETINE HCL 20 MG PO TABS: 30.0000 mg | ORAL_TABLET | Freq: Every day | ORAL | 0 refills | Status: DC

## 2018-08-22 NOTE — Telephone Encounter (Signed)
received a fax requesting a refill on  paroxetine hcl. pt was last seen on  06-27-18 and next appt is  09-26-18   PARoxetine (PAXIL) 20 MG tablet  Medication  Date: 04/30/2018 Department: [facility] Ordering/Authorizing: Jomarie LongsEappen, Saramma, MD  Order Providers   Prescribing Provider Encounter Provider  Jomarie LongsEappen, Saramma, MD Jomarie LongsEappen, Saramma, MD  Outpatient Medication Detail    Disp Refills Start End   PARoxetine (PAXIL) 20 MG tablet 135 tablet 0 04/30/2018    Sig - Route: Take 1.5 tablets (30 mg total) by mouth daily. - Oral   Sent to pharmacy as: PARoxetine (PAXIL) 20 MG tablet   E-Prescribing Status: Receipt confirmed by pharmacy (04/30/2018 2:04 PM EDT)

## 2018-08-22 NOTE — Telephone Encounter (Signed)
Sent Paxil to pharmacy.

## 2018-08-22 NOTE — Telephone Encounter (Signed)
received another fax requesting a refill on paroxetine     PARoxetine (PAXIL) 20 MG tablet  Medication  Date: 04/30/2018 Department: [facility] Ordering/Authorizing: Jomarie LongsEappen, Saramma, MD  Order Providers   Prescribing Provider Encounter Provider  Jomarie LongsEappen, Saramma, MD Jomarie LongsEappen, Saramma, MD  Outpatient Medication Detail    Disp Refills Start End   PARoxetine (PAXIL) 20 MG tablet 135 tablet 0 04/30/2018    Sig - Route: Take 1.5 tablets (30 mg total) by mouth daily. - Oral   Sent to pharmacy as: PARoxetine (PAXIL) 20 MG tablet   E-Prescribing Status: Receipt confirmed by pharmacy (04/30/2018 2:04 PM EDT)

## 2018-09-26 ENCOUNTER — Ambulatory Visit (INDEPENDENT_AMBULATORY_CARE_PROVIDER_SITE_OTHER): Payer: PPO | Admitting: Psychiatry

## 2018-09-26 ENCOUNTER — Other Ambulatory Visit: Payer: Self-pay

## 2018-09-26 ENCOUNTER — Encounter: Payer: Self-pay | Admitting: Psychiatry

## 2018-09-26 VITALS — BP 138/78 | HR 82 | Temp 98.1°F | Wt 222.2 lb

## 2018-09-26 DIAGNOSIS — R030 Elevated blood-pressure reading, without diagnosis of hypertension: Secondary | ICD-10-CM

## 2018-09-26 DIAGNOSIS — F321 Major depressive disorder, single episode, moderate: Secondary | ICD-10-CM

## 2018-09-26 NOTE — Progress Notes (Signed)
BH MD OP Progress Note  09/26/2018 5:54 PM Hayley Carter  MRN:  [id number]  Chief Complaint: ' I am here for follow up." Chief Complaint    Follow-up; Medication Refill     HPI: Hayley Carter is a 68 year old African-American female, single, retired, lives in [place], has a history of recent onset depression, headaches, history of hypertension, hyperlipidemia, diabetes mellitus, presented to the clinic today for a follow-up visit.  Patient today reports she is making progress on the current medication regimen.  She reports her anxiety and depressive symptoms are under better control on the current medications.  She denies any side effects.  Patient reports sleep is good.  Patient does report her blood pressure is elevated today.  She reports she is compliant on her antihypertensive medications.  Discussed with patient to monitor her blood pressure closely and reach out to her primary medical doctor. Visit Diagnosis:    ICD-10-CM   1. 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 Elevated blood pressure reading R03.0     Past Psychiatric History: I have reviewed past psychiatric history from my progress note on 11/26/2017.  Past Medical History:  Past Medical History:  Diagnosis Date  . Anxiety   . Arthritis   . Depression   . Diabetes mellitus without complication (HCC)   . Diabetes mellitus, type II (HCC)   . Hypercholesterolemia   . Hypertension     Past Surgical History:  Procedure Laterality Date  . COLONOSCOPY    . COLONOSCOPY N/A 06/19/2016   Procedure: COLONOSCOPY;  Surgeon: Christena DeemMartin U Skulskie, MD;  [facility] ENDOSCOPY;  Service: Endoscopy;  Laterality: N/A;    Family Psychiatric History: Reviewed family psychiatric history from my progress note on 11/26/2017. Family History:  Family History  Problem Relation Age of Onset  . Colon cancer Mother   . Hypertension Mother   . Stroke Father   . Heart  attack Father   . Ulcers Father   . Hypertension Father   . Migraines Sister   . Hypertension Sister   . Hypertension Brother   . Aneurysm Paternal Grandmother   . Prostate cancer Paternal Uncle   . Anxiety disorder Other   . Breast cancer Neg Hx     Social History: I have reviewed social history from my progress note on 11/26/2017. Social History   Socioeconomic History  . Marital status: Single    Spouse name: Not on file  . Number of children: 0  . Years of education: Not on file  . Highest education level: Some college, no degree  Occupational History    Comment: retired  Engineer, productionocial Needs  . Financial resource strain: Not hard at all  . Food insecurity:    Worry: Never true    Inability: Never true  . Transportation needs:    Medical: No    Non-medical: No  Tobacco Use  . Smoking status: Former Smoker    Packs/day: 0.25    Years: 25.00    Pack years: 6.25    Types: Cigarettes    Last attempt to quit: 09/04/1998    Years since quitting: 20.0  . Smokeless tobacco: Never Used  Substance and Sexual Activity  . Alcohol use: No  . Drug use: No  . Sexual activity: Never  Lifestyle  . Physical activity:    Days per week: 1 day    Minutes per session: 30 min  . Stress: Not at all  Relationships  .  Social connections:    Talks on phone: Twice a week    Gets together: Never    Attends religious service: Never    Active member of club or organization: No    Attends meetings of clubs or organizations: Never    Relationship status: Never married  Other Topics Concern  . Not on file  Social History Narrative  . Not on file    Allergies:  Allergies  Allergen Reactions  . Lisinopril Other (See Comments)    Headaches    Metabolic Disorder Labs: No results found for: HGBA1C, MPG No results found for: PROLACTIN No results found for: CHOL, TRIG, HDL, CHOLHDL, VLDL, LDLCALC No results found for: TSH  Therapeutic Level Labs: No results found for: LITHIUM No results  found for: VALPROATE No components found for:  CBMZ  Current Medications: Current Outpatient Medications  Medication Sig Dispense Refill  . Calcium Carb-Cholecalciferol (CALCIUM CARBONATE-VITAMIN D3 PO) Take 1 tablet by mouth daily.    Marland Kitchen gabapentin (NEURONTIN) 100 MG capsule Take 2 capsules (200 mg total) by mouth 2 (two) times daily. 360 capsule 1  . loratadine (CLARITIN) 10 MG tablet Take 10 mg by mouth daily.    . metFORMIN (GLUCOPHAGE-XR) 500 MG 24 hr tablet Take 500 mg by mouth daily with breakfast.    . Multiple Vitamin (MULTIVITAMIN) tablet Take 1 tablet by mouth daily.    Marland Kitchen PARoxetine (PAXIL) 20 MG tablet Take 1.5 tablets (30 mg total) by mouth daily. 135 tablet 0  . simvastatin (ZOCOR) 40 MG tablet Take 40 mg by mouth daily.    Marland Kitchen triamcinolone (NASACORT AQ) 55 MCG/ACT AERO nasal inhaler Place 2 sprays into the nose daily.    . valsartan-hydrochlorothiazide (DIOVAN-HCT) 160-25 MG tablet TAKE 1 TABLET BY MOUTH EVERY DAY FOR BLOOD PRESSURE  1   No current facility-administered medications for this visit.      Musculoskeletal: Strength & Muscle Tone: within normal limits Gait & Station: normal Patient leans: N/A  Psychiatric Specialty Exam: Review of Systems  Psychiatric/Behavioral: Negative for depression. The patient is not nervous/anxious.   All other systems reviewed and are negative.   Blood pressure 138/78, pulse 82, temperature 98.1 F (36.7 C), temperature source Oral, weight 222 lb 3.2 oz (100.8 kg).Body mass index is 36.98 kg/m.  General Appearance: Casual  Eye Contact:  Fair  Speech:  Clear and Coherent  Volume:  Normal  Mood:  Euthymic  Affect:  Congruent  Thought Process:  Goal Directed and Descriptions of Associations: Intact  Orientation:  Full (Time, Place, and Person)  Thought Content: Logical   Suicidal Thoughts:  No  Homicidal Thoughts:  No  Memory:  Immediate;   Fair Recent;   Fair Remote;   Fair  Judgement:  Fair  Insight:  Fair  Psychomotor  Activity:  Normal  Concentration:  Concentration: Fair and Attention Span: Fair  Recall:  [employer] of Knowledge: Fair  Language: Fair  Akathisia:  No  Handed:  Right  AIMS (if indicated): denies tremors, rigidity,stiffness  Assets:  Communication Skills Desire for Improvement Social Support  ADL's:  Intact  Cognition: WNL  Sleep:  Fair   Screenings:   Assessment and Plan: Kaylonie is a 68 year old African-American female who has a history of depression, diabetes mellitus, hyperlipidemia, vitamin B12, vitamin D deficiency, presented to clinic today for a follow-up visit.  Patient today reports she is making progress on the current medication regimen.  Will continue plan as noted below.  Plan MDD- stable Paxil  30 mg p.o. daily Gabapentin 200 mg p.o. twice daily.  For elevated blood pressure reading today Discussed with patient to monitor her blood pressure.  Will refer her back to her primary medical doctor.  Completed a PHQ 9 screening today and scored 0 on the same.  Discussed with patient that if she continues to stay stable on the medication she can be transitioned back to her primary medical doctor for further management.  However she will follow-up with writer in 3 to 4 months in this clinic.  I have spent atleast 15 minutes face to face with patient today. More than 50 % of the time was spent for psychoeducation and supportive psychotherapy and care coordination.  This note was generated in part or whole with voice recognition software. Voice recognition is usually quite accurate but there are transcription errors that can and very often do occur. I apologize for any typographical errors that were not detected and corrected.           Jomarie Longs, MD 09/26/2018, 5:54 PM

## 2018-10-03 ENCOUNTER — Ambulatory Visit (INDEPENDENT_AMBULATORY_CARE_PROVIDER_SITE_OTHER): Payer: PPO | Admitting: Licensed Clinical Social Worker

## 2018-10-03 DIAGNOSIS — F321 Major depressive disorder, single episode, moderate: Secondary | ICD-10-CM

## 2018-11-27 NOTE — Progress Notes (Signed)
   THERAPIST PROGRESS NOTE  Session Time: 45 min  Participation Level: Active  Behavioral Response: CasualAlertEuthymic  Type of Therapy: Individual Therapy  Treatment Goals addressed: Coping  Interventions: CBT and Motivational Interviewing  Summary: Hayley Carter is a 68 y.o. female who presents with a reduction in symptoms.  Provided support for Patient as she discussed her current mood. Stressed the importance of mood stabilization through learned coping skills and medication management.  Encouraged Patient to focus on her strengths and the positive aspects.   Suicidal/Homicidal: No  Plan: Return again in 2 weeks.  Diagnosis: Axis I: Major Depression, single episode    Axis II: No diagnosis    Marinda Elk, LCSW 10/03/2018

## 2018-12-05 ENCOUNTER — Other Ambulatory Visit: Payer: Self-pay | Admitting: Psychiatry

## 2018-12-26 DIAGNOSIS — Z0000 Encounter for general adult medical examination without abnormal findings: Secondary | ICD-10-CM | POA: Diagnosis not present

## 2018-12-26 DIAGNOSIS — E11630 Type 2 diabetes mellitus with periodontal disease: Secondary | ICD-10-CM | POA: Diagnosis not present

## 2018-12-26 DIAGNOSIS — E559 Vitamin D deficiency, unspecified: Secondary | ICD-10-CM | POA: Diagnosis not present

## 2018-12-26 DIAGNOSIS — E7800 Pure hypercholesterolemia, unspecified: Secondary | ICD-10-CM | POA: Diagnosis not present

## 2018-12-26 DIAGNOSIS — J301 Allergic rhinitis due to pollen: Secondary | ICD-10-CM | POA: Diagnosis not present

## 2018-12-26 DIAGNOSIS — E1159 Type 2 diabetes mellitus with other circulatory complications: Secondary | ICD-10-CM | POA: Diagnosis not present

## 2018-12-26 DIAGNOSIS — I10 Essential (primary) hypertension: Secondary | ICD-10-CM | POA: Diagnosis not present

## 2018-12-26 DIAGNOSIS — M170 Bilateral primary osteoarthritis of knee: Secondary | ICD-10-CM | POA: Diagnosis not present

## 2018-12-26 DIAGNOSIS — F411 Generalized anxiety disorder: Secondary | ICD-10-CM | POA: Diagnosis not present

## 2018-12-26 DIAGNOSIS — E1169 Type 2 diabetes mellitus with other specified complication: Secondary | ICD-10-CM | POA: Diagnosis not present

## 2018-12-26 DIAGNOSIS — Z79899 Other long term (current) drug therapy: Secondary | ICD-10-CM | POA: Diagnosis not present

## 2019-01-01 ENCOUNTER — Ambulatory Visit (INDEPENDENT_AMBULATORY_CARE_PROVIDER_SITE_OTHER): Payer: PPO | Admitting: Licensed Clinical Social Worker

## 2019-01-01 ENCOUNTER — Other Ambulatory Visit: Payer: Self-pay

## 2019-01-01 DIAGNOSIS — F419 Anxiety disorder, unspecified: Secondary | ICD-10-CM | POA: Diagnosis not present

## 2019-01-01 DIAGNOSIS — F321 Major depressive disorder, single episode, moderate: Secondary | ICD-10-CM | POA: Diagnosis not present

## 2019-01-22 NOTE — Progress Notes (Signed)
Virtual Visit via Telephone Note  I connected with [name] on 01/22/19 at  2:00 PM EDT by a video enabled telemedicine application and verified that I am speaking with the correct person using two identifiers.  Location: Patient: home Provider: office   I discussed the limitations of evaluation and management by telemedicine and the availability of in person appointments. The patient expressed understanding and agreed to proceed.   Session Time:  Participation Level: Active  Type of Therapy: Individual Therapy  Treatment Goals addressed: Anxiety and Coping  Interventions: CBT, Motivational Interviewing and Solution Focused  Summary: Hayley Carter is a 68 y.o. female who presents with reduction in symptoms.  Allowed Patient time to vent frustration about CoVid 19.  Discussion of coping skills due to limited social interaction.   Discussed triggers and stressors  Suicidal/Homicidal: No  Plan: Return again in 2 weeks.  Diagnosis: Axis I: Generalized Anxiety Disorder    Axis II: No diagnosis    The patient was advised to call back or seek an in-person evaluation if the symptoms worsen or if the condition fails to improve as anticipated.  I provided 30 minutes of non-face-to-face time during this encounter.   Marinda Elk, LCSW

## 2019-01-23 ENCOUNTER — Encounter: Payer: Self-pay | Admitting: Psychiatry

## 2019-01-23 ENCOUNTER — Other Ambulatory Visit: Payer: Self-pay

## 2019-01-23 ENCOUNTER — Ambulatory Visit (INDEPENDENT_AMBULATORY_CARE_PROVIDER_SITE_OTHER): Payer: PPO | Admitting: Psychiatry

## 2019-01-23 DIAGNOSIS — F324 Major depressive disorder, single episode, in partial remission: Secondary | ICD-10-CM

## 2019-01-23 MED ORDER — PAROXETINE HCL 20 MG PO TABS: 30.0000 mg | ORAL_TABLET | Freq: Every day | ORAL | 1 refills | Status: AC

## 2019-01-23 NOTE — Progress Notes (Signed)
Virtual Visit via Telephone Note  I connected with Hayley Carter on 01/23/19 at  2:00 PM EDT by telephone and verified that I am speaking with the correct person using two identifiers.   I discussed the limitations, risks, security and privacy concerns of performing an evaluation and management service by telephone and the availability of in person appointments. I also discussed with the patient that there may be a patient responsible charge related to this service. The patient expressed understanding and agreed to proceed.    I discussed the assessment and treatment plan with the patient. The patient was provided an opportunity to ask questions and all were answered. The patient agreed with the plan and demonstrated an understanding of the instructions.   The patient was advised to call back or seek an in-person evaluation if the symptoms worsen or if the condition fails to improve as anticipated.  BH MD OP Progress Note  01/23/2019 3:03 PM Hayley Carter  MRN:  [id number]  Chief Complaint:  Chief Complaint    Follow-up     HPI: Hayley Carter is a 68 year old African-American female, single, retired, lives in [place], has a history of depression, headaches, hypertension, hyperlipidemia, diabetes melitis was evaluated by phone today.  Patient today reports she is currently doing well on the current medication regimen.  She denies any significant anxiety or depressive symptoms at this time.  She is compliant with her Paxil as prescribed.  She denies any side effects.  She continues to take gabapentin which helps with her anxiety symptoms.  She reports sleep is good.  She reports therapy sessions with Ms. Kerby Nora is helpful and she has upcoming appointment.  Patient is agreeable to being transitioned back to her PMD since she is stable on her medications at this time. Visit Diagnosis:    ICD-10-CM   1. MDD (major depressive disorder), single episode, in partial remission (HCC)  F32.4 PARoxetine (PAXIL) 20 MG tablet    Past Psychiatric History: I have reviewed past psychiatric history from my progress note on 11/26/2017.  Past Medical History:  Past Medical History:  Diagnosis Date  . Anxiety   . Arthritis   . Depression   . Diabetes mellitus without complication (HCC)   . Diabetes mellitus, type II (HCC)   . Hypercholesterolemia   . Hypertension     Past Surgical History:  Procedure Laterality Date  . COLONOSCOPY    . COLONOSCOPY N/A 06/19/2016   Procedure: COLONOSCOPY;  Surgeon: Christena Deem, MD;  [facility]: [facility] ENDOSCOPY;  Service: Endoscopy;  Laterality: N/A;    Family Psychiatric History: Have reviewed family psychiatric history from my progress note on 11/26/2017.  Family History:  Family History  Problem Relation Age of Onset  . Colon cancer Mother   . Hypertension Mother   . Stroke Father   . Heart attack Father   . Ulcers Father   . Hypertension Father   . Migraines Sister   . Hypertension Sister   . Hypertension Brother   . Aneurysm Paternal Grandmother   . Prostate cancer Paternal Uncle   . Anxiety disorder Other   . Breast cancer Neg Hx     Social History: Reviewed social history from my progress note on 11/26/2017 Social History   Socioeconomic History  . Marital status: Single    Spouse name: Not on file  . Number of children: 0  . Years of education: Not on file  . Highest education level: Some college, no degree  Occupational History  Comment: retired  Engineer, productionocial Needs  . Financial resource strain: Not hard at all  . Food insecurity:    Worry: Never true    Inability: Never true  . Transportation needs:    Medical: No    Non-medical: No  Tobacco Use  . Smoking status: Former Smoker    Packs/day: 0.25    Years: 25.00    Pack years: 6.25    Types: Cigarettes    Last attempt to quit: 09/04/1998    Years since quitting: 20.4  . Smokeless tobacco: Never Used  Substance and Sexual Activity  . Alcohol use: No  .  Drug use: No  . Sexual activity: Never  Lifestyle  . Physical activity:    Days per week: 1 day    Minutes per session: 30 min  . Stress: Not at all  Relationships  . Social connections:    Talks on phone: Twice a week    Gets together: Never    Attends religious service: Never    Active member of club or organization: No    Attends meetings of clubs or organizations: Never    Relationship status: Never married  Other Topics Concern  . Not on file  Social History Narrative  . Not on file    Allergies:  Allergies  Allergen Reactions  . Lisinopril Other (See Comments)    Headaches    Metabolic Disorder Labs: No results found for: HGBA1C, MPG No results found for: PROLACTIN No results found for: CHOL, TRIG, HDL, CHOLHDL, VLDL, LDLCALC No results found for: TSH  Therapeutic Level Labs: No results found for: LITHIUM No results found for: VALPROATE No components found for:  CBMZ  Current Medications: Current Outpatient Medications  Medication Sig Dispense Refill  . Calcium Carb-Cholecalciferol (CALCIUM CARBONATE-VITAMIN D3 PO) Take 1 tablet by mouth daily.    Marland Kitchen. gabapentin (NEURONTIN) 100 MG capsule TAKE 2 CAPSULES (200 MG TOTAL) BY MOUTH 2 (TWO) TIMES DAILY. 360 capsule 1  . loratadine (CLARITIN) 10 MG tablet Take 10 mg by mouth daily.    . metFORMIN (GLUCOPHAGE-XR) 500 MG 24 hr tablet Take 500 mg by mouth daily with breakfast.    . Multiple Vitamin (MULTIVITAMIN) tablet Take 1 tablet by mouth daily.    Marland Kitchen. PARoxetine (PAXIL) 20 MG tablet Take 1.5 tablets (30 mg total) by mouth daily. 135 tablet 1  . simvastatin (ZOCOR) 40 MG tablet Take 40 mg by mouth daily.    Marland Kitchen. triamcinolone (NASACORT AQ) 55 MCG/ACT AERO nasal inhaler Place 2 sprays into the nose daily.    . valsartan-hydrochlorothiazide (DIOVAN-HCT) 160-25 MG tablet TAKE 1 TABLET BY MOUTH EVERY DAY FOR BLOOD PRESSURE  1   No current facility-administered medications for this visit.      Musculoskeletal: Strength &  Muscle Tone: UTA Gait & Station: UTA Patient leans: N/A  Psychiatric Specialty Exam: Review of Systems  Psychiatric/Behavioral: The patient is nervous/anxious.   All other systems reviewed and are negative.   There were no vitals taken for this visit.There is no height or weight on file to calculate BMI.  General Appearance: UTA  Eye Contact:  UTA  Speech:  Clear and Coherent  Volume:  Normal  Mood:  Anxious  Affect:  UTA  Thought Process:  Goal Directed and Descriptions of Associations: Intact  Orientation:  Full (Time, Place, and Person)  Thought Content: Logical   Suicidal Thoughts:  No  Homicidal Thoughts:  No  Memory:  Immediate;   Fair Recent;   Fair Remote;  Fair  Judgement:  Fair  Insight:  Fair  Psychomotor Activity:  UTA  Concentration:  Concentration: Fair and Attention Span: Fair  Recall:  [employer] of Knowledge: Fair  Language: Fair  Akathisia:  UTA  Handed:  Right  AIMS (if indicated): Denies tremors, rigidity  Assets:  Communication Skills Desire for Improvement Housing Social Support  ADL's:  Intact  Cognition: WNL  Sleep:  Fair   Screenings:   Assessment and Plan: Donise is a 68 year old African-American female who has a history of MDD, diabetes melitis, hyperlipidemia, vitamin B12, vitamin D deficiency was evaluated by telephone today.  Patient today reports she is currently making progress on the current medication regimen and denies any significant depression or anxiety.  Plan as noted below.  Plan MDD-stable Paxil 30 mg p.o. daily Gabapentin 200 mg p.o. twice daily  Patient has been doing well on the Paxil and gabapentin combination.  Will refer her back to her primary medical doctor who can continue her medications.  She had this discussion with her provider who agrees.  Patient to continue to follow-up in clinic as needed or if her symptoms worsen.  I have spent atleast 15 minutes non face to face with patient today. More than 50 % of  the time was spent for psychoeducation and supportive psychotherapy and care coordination.  This note was generated in part or whole with voice recognition software. Voice recognition is usually quite accurate but there are transcription errors that can and very often do occur. I apologize for any typographical errors that were not detected and corrected.        Jomarie Longs, MD 01/23/2019, 3:03 PM

## 2019-01-31 ENCOUNTER — Ambulatory Visit (INDEPENDENT_AMBULATORY_CARE_PROVIDER_SITE_OTHER): Payer: PPO | Admitting: Licensed Clinical Social Worker

## 2019-01-31 ENCOUNTER — Other Ambulatory Visit: Payer: Self-pay

## 2019-01-31 DIAGNOSIS — F324 Major depressive disorder, single episode, in partial remission: Secondary | ICD-10-CM

## 2019-02-05 NOTE — Progress Notes (Signed)
Virtual Visit via Telephone Note  I connected with Merril Abbe on 01/31/19 at  9:00 AM EDT by telephone and verified that I am speaking with the correct person using two identifiers.  Location: Patient: home Provider: office   I discussed the limitations, risks, security and privacy concerns of performing an evaluation and management service by telephone and the availability of in person appointments. I also discussed with the patient that there may be a patient responsible charge related to this service. The patient expressed understanding and agreed to proceed.     Participation Level: Active  Type of Therapy: Individual Therapy  Treatment Goals addressed: Coping  Interventions: CBT and Motivational Interviewing  Summary: Hayley Carter is a 68 y.o. female who presents with a reduction in symptoms.  Provided support for Patient as she discussed her current mood. Explored triggers and reviewed effective coping strategies.  Suicidal/Homicidal: No  Plan: Return again in 2 weeks.  Diagnosis: Axis I: Depression    Axis II: No diagnosis    I discussed the assessment and treatment plan with the patient. The patient was provided an opportunity to ask questions and all were answered. The patient agreed with the plan and demonstrated an understanding of the instructions.   The patient was advised to call back or seek an in-person evaluation if the symptoms worsen or if the condition fails to improve as anticipated.  I provided 30 minutes of non-face-to-face time during this encounter.   Marinda Elk, LCSW

## 2019-02-28 ENCOUNTER — Other Ambulatory Visit: Payer: Self-pay

## 2019-02-28 ENCOUNTER — Ambulatory Visit: Payer: PPO | Admitting: Licensed Clinical Social Worker

## 2019-03-26 ENCOUNTER — Other Ambulatory Visit: Payer: Self-pay

## 2019-03-26 ENCOUNTER — Ambulatory Visit: Payer: PPO | Admitting: Licensed Clinical Social Worker

## 2019-04-18 ENCOUNTER — Ambulatory Visit (INDEPENDENT_AMBULATORY_CARE_PROVIDER_SITE_OTHER): Payer: PPO | Admitting: Licensed Clinical Social Worker

## 2019-04-18 ENCOUNTER — Other Ambulatory Visit: Payer: Self-pay

## 2019-04-18 DIAGNOSIS — F419 Anxiety disorder, unspecified: Secondary | ICD-10-CM | POA: Diagnosis not present

## 2019-04-18 DIAGNOSIS — F324 Major depressive disorder, single episode, in partial remission: Secondary | ICD-10-CM | POA: Diagnosis not present

## 2019-05-08 ENCOUNTER — Other Ambulatory Visit: Payer: Self-pay | Admitting: Psychiatry

## 2019-05-16 ENCOUNTER — Ambulatory Visit: Payer: PPO | Admitting: Licensed Clinical Social Worker

## 2019-05-26 ENCOUNTER — Other Ambulatory Visit: Payer: Self-pay

## 2019-05-26 ENCOUNTER — Ambulatory Visit (INDEPENDENT_AMBULATORY_CARE_PROVIDER_SITE_OTHER): Payer: PPO | Admitting: Licensed Clinical Social Worker

## 2019-05-26 ENCOUNTER — Encounter: Payer: Self-pay | Admitting: Licensed Clinical Social Worker

## 2019-05-26 DIAGNOSIS — F324 Major depressive disorder, single episode, in partial remission: Secondary | ICD-10-CM

## 2019-05-26 NOTE — Progress Notes (Signed)
Virtual Visit via Telephone Note  I connected with Hayley Carter on 05/26/19 at 11:00 AM EDT by telephone and verified that I am speaking with the correct person using two identifiers.   I discussed the limitations, risks, security and privacy concerns of performing an evaluation and management service by telephone and the availability of in person appointments. I also discussed with the patient that there may be a patient responsible charge related to this service. The patient expressed understanding and agreed to proceed.  I discussed the assessment and treatment plan with the patient. The patient was provided an opportunity to ask questions and all were answered. The patient agreed with the plan and demonstrated an understanding of the instructions.   The patient was advised to call back or seek an in-person evaluation if the symptoms worsen or if the condition fails to improve as anticipated.  I provided 23 minutes of non-face-to-face time during this encounter.   Alden Hipp, LCSW    THERAPIST PROGRESS NOTE  Session Time: 1100  Participation Level: Minimal  Behavioral Response: CasualAlertNA  Type of Therapy: Individual Therapy  Treatment Goals addressed: Coping  Interventions: Supportive  Summary: Hayley Carter is a 68 y.o. female who presents with continued symptoms related to her diagnosis. Hayley Carter reports she usually sees another therapist in the clinic, and they have been checking in 1x monthly to ensure Hayley Carter is doing well. She reports she plans to continue seeing that therapist, but she was not available this month so needed to be seen by LCSW. LCSW validated these feelings and asked Hayley Carter what she would like to discuss during today's session. She reports she has been adherent with all medications and is feeling very well. She reports her depression has improved, as well as her headaches, and she has been better able to do things she enjoys doing. She  reported having more motivation to complete tasks throughout the day, and even more motivation to get out of bed in the morning. LCSW validated those feelings and encouraged Hayley Carter to continue on the path she's on, as it seems to be working. She reported still processing grief over the loss of her brother, but feels she has improved in that area as well. LCSW held space for Hayley Carter to discuss her brother and subsequent grief. We also discussed how grief is not linear, and it looks differently for everyone. Hayley Carter expressed understanding and agreement with this information.   Suicidal/Homicidal: No  Therapist Response: [place] continues to work towards her tx goals but has not yet reached them. She will continue to see the other therapist in the clinic to work towards her tx goals moving forward.   Plan: Return again in 4 weeks.  Diagnosis: Axis I: MDD    Axis II: No diagnosis    Alden Hipp, LCSW 05/26/2019

## 2019-06-17 ENCOUNTER — Ambulatory Visit: Payer: PPO | Admitting: Licensed Clinical Social Worker

## 2019-06-17 ENCOUNTER — Encounter

## 2019-07-04 DIAGNOSIS — Z0000 Encounter for general adult medical examination without abnormal findings: Secondary | ICD-10-CM | POA: Diagnosis not present

## 2019-07-04 DIAGNOSIS — J301 Allergic rhinitis due to pollen: Secondary | ICD-10-CM | POA: Diagnosis not present

## 2019-07-04 DIAGNOSIS — E538 Deficiency of other specified B group vitamins: Secondary | ICD-10-CM | POA: Diagnosis not present

## 2019-07-04 DIAGNOSIS — I10 Essential (primary) hypertension: Secondary | ICD-10-CM | POA: Diagnosis not present

## 2019-07-04 DIAGNOSIS — E11630 Type 2 diabetes mellitus with periodontal disease: Secondary | ICD-10-CM | POA: Diagnosis not present

## 2019-07-04 DIAGNOSIS — M170 Bilateral primary osteoarthritis of knee: Secondary | ICD-10-CM | POA: Diagnosis not present

## 2019-07-04 DIAGNOSIS — Z79899 Other long term (current) drug therapy: Secondary | ICD-10-CM | POA: Diagnosis not present

## 2019-07-04 DIAGNOSIS — F411 Generalized anxiety disorder: Secondary | ICD-10-CM | POA: Diagnosis not present

## 2019-07-04 DIAGNOSIS — E559 Vitamin D deficiency, unspecified: Secondary | ICD-10-CM | POA: Diagnosis not present

## 2019-07-04 DIAGNOSIS — Z23 Encounter for immunization: Secondary | ICD-10-CM | POA: Diagnosis not present

## 2019-07-04 DIAGNOSIS — E1169 Type 2 diabetes mellitus with other specified complication: Secondary | ICD-10-CM | POA: Diagnosis not present

## 2019-07-04 DIAGNOSIS — E1159 Type 2 diabetes mellitus with other circulatory complications: Secondary | ICD-10-CM | POA: Diagnosis not present

## 2019-07-04 DIAGNOSIS — E7800 Pure hypercholesterolemia, unspecified: Secondary | ICD-10-CM | POA: Diagnosis not present

## 2019-07-05 NOTE — Progress Notes (Signed)
Virtual Visit via Telephone Note  I connected with Hayley Carter on 04/18/19 at 10:00 AM EDT by telephone and verified that I am speaking with the correct person using two identifiers.  Location: Patient: home Provider: office   I discussed the limitations, risks, security and privacy concerns of performing an evaluation and management service by telephone and the availability of in person appointments. I also discussed with the patient that there may be a patient responsible charge related to this service. The patient expressed understanding and agreed to proceed.    I discussed the assessment and treatment plan with the patient. The patient was provided an opportunity to ask questions and all were answered. The patient agreed with the plan and demonstrated an understanding of the instructions.   The patient was advised to call back or seek an in-person evaluation if the symptoms worsen or if the condition fails to improve as anticipated.  I provided 60 minutes of non-face-to-face time during this encounter.      Participation Level: Active  Type of Therapy: Individual Therapy  Treatment Goals addressed: Anxiety and Coping  Interventions: CBT and Motivational Interviewing  Summary: Hayley Carter is a 68 y.o. female who presents with a reduction in symptoms.  Actively listened as she discussed stressors and CoVid 19.  Therapist reviewed with Patient her goals.  Therapist actively listened as Patient reports being able to make progress toward her goals.  Therapist reviewed with Patient build social supports.  Therapist discussed with Patient the importance of social support.  Therpist utilized [employer] Topic 4: Building Social Supports to assisted Patient with strategies for getting closer to people.  Therapist role-played with Patient how to develop closer relationships.  Therapist allowed Patient time to complete the exercise: Things you can say to increase closeness. At the end of  the session, Therapist reviewed with Patient things you can do to develop closer relationships.   Suicidal/Homicidal: No  Plan: Return again in 2 weeks.  Diagnosis: Axis I: Generalized Anxiety Disorder and Mood Disorder NOS    Axis II: No diagnosis    Lubertha South, LCSW

## 2019-10-03 DIAGNOSIS — E119 Type 2 diabetes mellitus without complications: Secondary | ICD-10-CM | POA: Diagnosis not present

## 2020-01-02 ENCOUNTER — Other Ambulatory Visit: Payer: Self-pay | Admitting: Internal Medicine

## 2020-01-02 DIAGNOSIS — Z79899 Other long term (current) drug therapy: Secondary | ICD-10-CM | POA: Diagnosis not present

## 2020-01-02 DIAGNOSIS — E7800 Pure hypercholesterolemia, unspecified: Secondary | ICD-10-CM | POA: Diagnosis not present

## 2020-01-02 DIAGNOSIS — F411 Generalized anxiety disorder: Secondary | ICD-10-CM | POA: Diagnosis not present

## 2020-01-02 DIAGNOSIS — Z0000 Encounter for general adult medical examination without abnormal findings: Secondary | ICD-10-CM | POA: Diagnosis not present

## 2020-01-02 DIAGNOSIS — Z1239 Encounter for other screening for malignant neoplasm of breast: Secondary | ICD-10-CM | POA: Diagnosis not present

## 2020-01-02 DIAGNOSIS — E11630 Type 2 diabetes mellitus with periodontal disease: Secondary | ICD-10-CM | POA: Diagnosis not present

## 2020-01-02 DIAGNOSIS — E538 Deficiency of other specified B group vitamins: Secondary | ICD-10-CM | POA: Diagnosis not present

## 2020-01-02 DIAGNOSIS — Z1231 Encounter for screening mammogram for malignant neoplasm of breast: Secondary | ICD-10-CM

## 2020-01-02 DIAGNOSIS — M170 Bilateral primary osteoarthritis of knee: Secondary | ICD-10-CM | POA: Diagnosis not present

## 2020-01-02 DIAGNOSIS — E1159 Type 2 diabetes mellitus with other circulatory complications: Secondary | ICD-10-CM | POA: Diagnosis not present

## 2020-01-02 DIAGNOSIS — J301 Allergic rhinitis due to pollen: Secondary | ICD-10-CM | POA: Diagnosis not present

## 2020-01-02 DIAGNOSIS — E559 Vitamin D deficiency, unspecified: Secondary | ICD-10-CM | POA: Diagnosis not present

## 2020-01-02 DIAGNOSIS — E1169 Type 2 diabetes mellitus with other specified complication: Secondary | ICD-10-CM | POA: Diagnosis not present

## 2020-01-06 ENCOUNTER — Ambulatory Visit
Admission: RE | Admit: 2020-01-06 | Discharge: 2020-01-06 | Disposition: A | Discharge status: Home / Self Care | Payer: PPO | Source: Ambulatory Visit | Attending: Internal Medicine | Admitting: Internal Medicine

## 2020-01-06 DIAGNOSIS — Z1231 Encounter for screening mammogram for malignant neoplasm of breast: Secondary | ICD-10-CM | POA: Insufficient documentation

## 2020-01-29 ENCOUNTER — Other Ambulatory Visit: Payer: Self-pay

## 2020-01-29 ENCOUNTER — Ambulatory Visit (INDEPENDENT_AMBULATORY_CARE_PROVIDER_SITE_OTHER): Payer: PPO | Admitting: Licensed Clinical Social Worker

## 2020-01-29 DIAGNOSIS — F411 Generalized anxiety disorder: Secondary | ICD-10-CM

## 2020-01-29 NOTE — Progress Notes (Signed)
Virtual Visit via Telephone Note  I connected with Hayley Carter on 01/29/20 at  4:00 PM EDT by telephone and verified that I am speaking with the correct person using two identifiers.  Location: Patient: home Provider: [facility]   I discussed the limitations, risks, security and privacy concerns of performing an evaluation and management service by telephone and the availability of in person appointments. I also discussed with the patient that there may be a patient responsible charge related to this service. The patient expressed understanding and agreed to proceed.   I discussed the assessment and treatment plan with the patient. The patient was provided an opportunity to ask questions and all were answered. The patient agreed with the plan and demonstrated an understanding of the instructions.   The patient was advised to call back or seek an in-person evaluation if the symptoms worsen or if the condition fails to improve as anticipated.   THERAPIST PROGRESS NOTE  Session Time: 45 min  Participation Level: Active  Behavioral Response: NAAlertimproving mood  Type of Therapy: Individual Therapy  Treatment Goals addressed: Coping  Interventions: CBT; Mindfulness  Summary: Hayley Carter is a 69 y.o. female who presents with continuing stress associated with recent loss of a great-niece in January and sister in March. LCSW allowed pt to explore and express thoughts and feelings associated with the loss. LCSW encouraged pts continued engagement with social supports (friends, church).  Encouraged focus on overall wellness.  Suicidal/Homicidal: Nowithout intent/plan  Therapist Response: Hayley Carter is progressing well. Pt reports that overall mood has improved and that she is managing stress better.  Plan: Return again in 6 weeks.  Diagnosis: Axis I: Generalized Anxiety Disorder    Axis II: No diagnosis    Ernest Haber Thamas Appleyard, LCSW 01/29/2020

## 2020-02-17 DIAGNOSIS — H900 Conductive hearing loss, bilateral: Secondary | ICD-10-CM | POA: Diagnosis not present

## 2020-02-17 DIAGNOSIS — H6123 Impacted cerumen, bilateral: Secondary | ICD-10-CM | POA: Diagnosis not present

## 2020-07-05 DIAGNOSIS — Z79899 Other long term (current) drug therapy: Secondary | ICD-10-CM | POA: Diagnosis not present

## 2020-07-05 DIAGNOSIS — Z23 Encounter for immunization: Secondary | ICD-10-CM | POA: Diagnosis not present

## 2020-07-05 DIAGNOSIS — Z0000 Encounter for general adult medical examination without abnormal findings: Secondary | ICD-10-CM | POA: Diagnosis not present

## 2020-07-05 DIAGNOSIS — E7800 Pure hypercholesterolemia, unspecified: Secondary | ICD-10-CM | POA: Diagnosis not present

## 2020-07-05 DIAGNOSIS — E559 Vitamin D deficiency, unspecified: Secondary | ICD-10-CM | POA: Diagnosis not present

## 2020-07-05 DIAGNOSIS — M170 Bilateral primary osteoarthritis of knee: Secondary | ICD-10-CM | POA: Diagnosis not present

## 2020-07-05 DIAGNOSIS — E538 Deficiency of other specified B group vitamins: Secondary | ICD-10-CM | POA: Diagnosis not present

## 2020-07-05 DIAGNOSIS — E1159 Type 2 diabetes mellitus with other circulatory complications: Secondary | ICD-10-CM | POA: Diagnosis not present

## 2020-07-05 DIAGNOSIS — E1169 Type 2 diabetes mellitus with other specified complication: Secondary | ICD-10-CM | POA: Diagnosis not present

## 2020-07-05 DIAGNOSIS — F411 Generalized anxiety disorder: Secondary | ICD-10-CM | POA: Diagnosis not present

## 2020-07-05 DIAGNOSIS — J301 Allergic rhinitis due to pollen: Secondary | ICD-10-CM | POA: Diagnosis not present

## 2020-07-05 DIAGNOSIS — Z6841 Body Mass Index (BMI) 40.0 and over, adult: Secondary | ICD-10-CM | POA: Diagnosis not present

## 2020-07-05 DIAGNOSIS — E11630 Type 2 diabetes mellitus with periodontal disease: Secondary | ICD-10-CM | POA: Diagnosis not present

## 2020-07-13 DIAGNOSIS — E538 Deficiency of other specified B group vitamins: Secondary | ICD-10-CM | POA: Diagnosis not present

## 2020-07-13 DIAGNOSIS — Z79899 Other long term (current) drug therapy: Secondary | ICD-10-CM | POA: Diagnosis not present

## 2020-07-13 DIAGNOSIS — E1169 Type 2 diabetes mellitus with other specified complication: Secondary | ICD-10-CM | POA: Diagnosis not present

## 2020-07-13 DIAGNOSIS — E7800 Pure hypercholesterolemia, unspecified: Secondary | ICD-10-CM | POA: Diagnosis not present

## 2021-11-23 ENCOUNTER — Other Ambulatory Visit: Payer: Self-pay | Admitting: Gerontology

## 2021-11-23 DIAGNOSIS — Z1231 Encounter for screening mammogram for malignant neoplasm of breast: Secondary | ICD-10-CM

## 2022-01-03 ENCOUNTER — Ambulatory Visit
Admission: RE | Admit: 2022-01-03 | Discharge: 2022-01-03 | Disposition: A | Discharge status: Home / Self Care | Payer: PPO | Source: Ambulatory Visit | Attending: Gerontology | Admitting: Gerontology

## 2022-01-03 DIAGNOSIS — Z1231 Encounter for screening mammogram for malignant neoplasm of breast: Secondary | ICD-10-CM | POA: Diagnosis not present

## 2022-05-22 ENCOUNTER — Encounter: Payer: Self-pay | Admitting: Anesthesiology

## 2022-05-22 ENCOUNTER — Encounter
Admission: RE | Disposition: A | Discharge status: Home / Self Care | Payer: Self-pay | Source: Home / Self Care | Attending: Gastroenterology

## 2022-05-22 ENCOUNTER — Ambulatory Visit: Payer: PPO | Admitting: Anesthesiology

## 2022-05-22 ENCOUNTER — Ambulatory Visit
Admission: RE | Admit: 2022-05-22 | Discharge: 2022-05-22 | Disposition: A | Discharge status: Home / Self Care | Payer: PPO | Attending: Gastroenterology | Admitting: Gastroenterology

## 2022-05-22 DIAGNOSIS — Z800 Family history of malignant neoplasm of digestive organs: Secondary | ICD-10-CM | POA: Insufficient documentation

## 2022-05-22 DIAGNOSIS — E7800 Pure hypercholesterolemia, unspecified: Secondary | ICD-10-CM | POA: Diagnosis not present

## 2022-05-22 DIAGNOSIS — I10 Essential (primary) hypertension: Secondary | ICD-10-CM | POA: Diagnosis not present

## 2022-05-22 DIAGNOSIS — Z79899 Other long term (current) drug therapy: Secondary | ICD-10-CM | POA: Diagnosis not present

## 2022-05-22 DIAGNOSIS — Z87891 Personal history of nicotine dependence: Secondary | ICD-10-CM | POA: Insufficient documentation

## 2022-05-22 DIAGNOSIS — Z1211 Encounter for screening for malignant neoplasm of colon: Secondary | ICD-10-CM | POA: Diagnosis present

## 2022-05-22 DIAGNOSIS — K5730 Diverticulosis of large intestine without perforation or abscess without bleeding: Secondary | ICD-10-CM | POA: Insufficient documentation

## 2022-05-22 DIAGNOSIS — Q438 Other specified congenital malformations of intestine: Secondary | ICD-10-CM | POA: Insufficient documentation

## 2022-05-22 DIAGNOSIS — Z6839 Body mass index (BMI) 39.0-39.9, adult: Secondary | ICD-10-CM | POA: Insufficient documentation

## 2022-05-22 DIAGNOSIS — Z7984 Long term (current) use of oral hypoglycemic drugs: Secondary | ICD-10-CM | POA: Diagnosis not present

## 2022-05-22 DIAGNOSIS — E119 Type 2 diabetes mellitus without complications: Secondary | ICD-10-CM | POA: Insufficient documentation

## 2022-05-22 DIAGNOSIS — K640 First degree hemorrhoids: Secondary | ICD-10-CM | POA: Insufficient documentation

## 2022-05-22 DIAGNOSIS — F418 Other specified anxiety disorders: Secondary | ICD-10-CM | POA: Insufficient documentation

## 2022-05-22 HISTORY — PX: COLONOSCOPY WITH PROPOFOL: SHX5780

## 2022-05-22 LAB — GLUCOSE, CAPILLARY: Glucose-Capillary: 154 mg/dL — ABNORMAL HIGH (ref 70–99)

## 2022-05-22 SURGERY — COLONOSCOPY WITH PROPOFOL
Anesthesia: General

## 2022-05-22 MED ORDER — SODIUM CHLORIDE 0.9 % IV SOLN
INTRAVENOUS | Status: DC
  Administered 2022-05-22: 20 mL/h via INTRAVENOUS

## 2022-05-22 MED ORDER — PROPOFOL 500 MG/50ML IV EMUL
INTRAVENOUS | Status: DC | PRN
  Administered 2022-05-22: 150 ug/kg/min via INTRAVENOUS

## 2022-05-22 MED ORDER — PROPOFOL 1000 MG/100ML IV EMUL
INTRAVENOUS | Status: AC
  Filled 2022-05-22: qty 100

## 2022-05-22 NOTE — Anesthesia Preprocedure Evaluation (Signed)
Anesthesia Evaluation  Patient identified by MRN, date of birth, ID band Patient awake    Reviewed: Allergy & Precautions, NPO status , Patient's Chart, lab work & pertinent test results  Airway Mallampati: III  TM Distance: <3 FB Neck ROM: full    Dental  (+) Upper Dentures, Lower Dentures   Pulmonary neg pulmonary ROS, former smoker,    Pulmonary exam normal  + decreased breath sounds      Cardiovascular Exercise Tolerance: Good hypertension, Pt. on medications negative cardio ROS Normal cardiovascular exam Rhythm:Regular     Neuro/Psych Anxiety Depression negative neurological ROS  negative psych ROS   GI/Hepatic negative GI ROS, Neg liver ROS,   Endo/Other  negative endocrine ROSdiabetes, Well Controlled, Type 2, Oral Hypoglycemic AgentsMorbid obesity  Renal/GU negative Renal ROS  negative genitourinary   Musculoskeletal   Abdominal (+) + obese,   Peds negative pediatric ROS (+)  Hematology negative hematology ROS (+)   Anesthesia Other Findings Past Medical History: No date: Anxiety No date: Arthritis No date: Depression No date: Diabetes mellitus without complication (HCC) No date: Diabetes mellitus, type II ([place]) No date: Hypercholesterolemia No date: Hypertension  Past Surgical History: No date: COLONOSCOPY 06/19/2016: COLONOSCOPY; N/A     Comment:  Procedure: COLONOSCOPY;  Surgeon: Lollie Sails, MD;              Location: [facility] ENDOSCOPY;  Service: Endoscopy;                Laterality: N/A;  BMI    Body Mass Index: 39.94 kg/m      Reproductive/Obstetrics negative OB ROS                             Anesthesia Physical Anesthesia Plan  ASA: 3  Anesthesia Plan: General   Post-op Pain Management:    Induction: Intravenous  PONV Risk Score and Plan: Propofol infusion and TIVA  Airway Management Planned: Natural Airway  Additional Equipment:   Intra-op  Plan:   Post-operative Plan:   Informed Consent: I have reviewed the patients History and Physical, chart, labs and discussed the procedure including the risks, benefits and alternatives for the proposed anesthesia with the patient or authorized representative who has indicated his/her understanding and acceptance.     Dental Advisory Given  Plan Discussed with: CRNA and Surgeon  Anesthesia Plan Comments:         Anesthesia Quick Evaluation

## 2022-05-22 NOTE — H&P (Signed)
Outpatient short stay form Pre-procedure 05/22/2022  Hayley Rubenstein, MD  Primary Physician: Ezequiel Kayser, MD  Reason for visit:  Screening colonoscopy  History of present illness:    71 y/o lady with history of hypertension, HLD, and DM II here for screening colonoscopy for family history of colon cancer. Last colonoscopy about 5 years ago. Mother was 34 when diagnosed with colon cancer. No blood thinners. No significant abdominal surgeries.    Current Facility-Administered Medications:    0.9 %  sodium chloride infusion, , Intravenous, Continuous, Nilam Quakenbush, Hilton Cork, MD  Medications Prior to Admission  Medication Sig Dispense Refill Last Dose   Calcium Carb-Cholecalciferol (CALCIUM CARBONATE-VITAMIN D3 PO) Take 1 tablet by mouth daily.   05/21/2022   gabapentin (NEURONTIN) 100 MG capsule TAKE 2 CAPSULES (200 MG TOTAL) BY MOUTH 2 (TWO) TIMES DAILY. 360 capsule 1 05/21/2022   loratadine (CLARITIN) 10 MG tablet Take 10 mg by mouth daily.   05/21/2022   metFORMIN (GLUCOPHAGE-XR) 500 MG 24 hr tablet Take 500 mg by mouth daily with breakfast.   05/21/2022   Multiple Vitamin (MULTIVITAMIN) tablet Take 1 tablet by mouth daily.   05/21/2022   PARoxetine (PAXIL) 20 MG tablet Take 1.5 tablets (30 mg total) by mouth daily. 135 tablet 1 05/21/2022   simvastatin (ZOCOR) 40 MG tablet Take 40 mg by mouth daily.   05/21/2022   triamcinolone (NASACORT AQ) 55 MCG/ACT AERO nasal inhaler Place 2 sprays into the nose daily.   05/21/2022   valsartan-hydrochlorothiazide (DIOVAN-HCT) 160-25 MG tablet TAKE 1 TABLET BY MOUTH EVERY DAY FOR BLOOD PRESSURE  1 05/22/2022 at 0600     Allergies  Allergen Reactions   Lisinopril Other (See Comments)    Headaches     Past Medical History:  Diagnosis Date   Anxiety    Arthritis    Depression    Diabetes mellitus without complication ([place])    Diabetes mellitus, type II ([place])    Hypercholesterolemia    Hypertension     Review of systems:  Otherwise negative.     Physical Exam  Gen: Alert, oriented. Appears stated age.  HEENT: PERRLA. Lungs: No respiratory distress CV: RRR Abd: soft, benign, no masses Ext: No edema    Planned procedures: Proceed with colonoscopy. The patient understands the nature of the planned procedure, indications, risks, alternatives and potential complications including but not limited to bleeding, infection, perforation, damage to internal organs and possible oversedation/side effects from anesthesia. The patient agrees and gives consent to proceed.  Please refer to procedure notes for findings, recommendations and patient disposition/instructions.     Hayley Rubenstein, MD [facility]

## 2022-05-22 NOTE — Anesthesia Procedure Notes (Signed)
Date/Time: 05/22/2022 9:53 AM  Performed by: Donalda Ewings, CindyPre-anesthesia Checklist: Patient identified, Emergency Drugs available, Suction available, Patient being monitored and Timeout performed Patient Re-evaluated:Patient Re-evaluated prior to induction Oxygen Delivery Method: Supernova nasal CPAP Preoxygenation: Pre-oxygenation with 100% oxygen Induction Type: IV induction Placement Confirmation: positive ETCO2 and CO2 detector

## 2022-05-22 NOTE — Anesthesia Postprocedure Evaluation (Signed)
Anesthesia Post Note  Patient: Hayley Carter  Procedure(s) Performed: COLONOSCOPY WITH PROPOFOL  Patient location during evaluation: PACU Anesthesia Type: General Level of consciousness: awake and oriented Pain management: pain level controlled Vital Signs Assessment: post-procedure vital signs reviewed and stable Respiratory status: spontaneous breathing and respiratory function stable Cardiovascular status: stable Anesthetic complications: no   No notable events documented.   Last Vitals:  Vitals:   05/22/22 0922 05/22/22 1013  BP: (!) 161/93 (!) 101/58  Pulse: 80   Resp: 20 16  Temp: (!) 35.6 C   SpO2: 98%     Last Pain:  Vitals:   05/22/22 1013  TempSrc:   PainSc: 0-No pain                 VAN STAVEREN,Che Rachal

## 2022-05-22 NOTE — Op Note (Signed)
[facility] Patient Name: Hayley Carter Procedure Date: 05/22/2022 9:44 AM MRN: [id number] Account #: [id number] Date of Birth: [birth year] Admit Type: Outpatient Age: 71 Room: [facility] ENDO ROOM 3 Gender: Female Note Status: Finalized Instrument Name: Jasper Riling [id number] Procedure:             Colonoscopy Indications:           Screening patient at increased risk: Family history of                         1st-degree relative with colorectal cancer at age 17                         years (or older) Providers:             Andrey Farmer MD, MD Medicines:             Monitored Anesthesia Care Complications:         No immediate complications. Procedure:             Pre-Anesthesia Assessment:                        - Prior to the procedure, a History and Physical was                         performed, and patient medications and allergies were                         reviewed. The patient is competent. The risks and                         benefits of the procedure and the sedation options and                         risks were discussed with the patient. All questions                         were answered and informed consent was obtained.                         Patient identification and proposed procedure were                         verified by the physician, the nurse, the                         anesthesiologist, the anesthetist and the technician                         in the endoscopy suite. Mental Status Examination:                         alert and oriented. Airway Examination: normal                         oropharyngeal airway and neck mobility. Respiratory                         Examination: clear to auscultation. CV Examination:  normal. Prophylactic Antibiotics: The patient does not                         require prophylactic antibiotics. Prior                         Anticoagulants: The patient has taken no  previous                         anticoagulant or antiplatelet agents. ASA Grade                         Assessment: II - A patient with mild systemic disease.                         After reviewing the risks and benefits, the patient                         was deemed in satisfactory condition to undergo the                         procedure. The anesthesia plan was to use monitored                         anesthesia care (MAC). Immediately prior to                         administration of medications, the patient was                         re-assessed for adequacy to receive sedatives. The                         heart rate, respiratory rate, oxygen saturations,                         blood pressure, adequacy of pulmonary ventilation, and                         response to care were monitored throughout the                         procedure. The physical status of the patient was                         re-assessed after the procedure.                        After obtaining informed consent, the colonoscope was                         passed under direct vision. Throughout the procedure,                         the patient's blood pressure, pulse, and oxygen                         saturations were monitored continuously. The  Colonoscope was introduced through the anus and                         advanced to the the cecum, identified by appendiceal                         orifice and ileocecal valve. The colonoscopy was                         somewhat difficult due to a redundant colon. The                         patient tolerated the procedure well. The quality of                         the bowel preparation was good. Findings:      The perianal and digital rectal examinations were normal.      Multiple small and large-mouthed diverticula were found in the sigmoid       colon.      Internal hemorrhoids were found during retroflexion. The hemorrhoids        were Grade I (internal hemorrhoids that do not prolapse).      The exam was otherwise without abnormality on direct and retroflexion       views. Impression:            - Diverticulosis in the sigmoid colon.                        - Internal hemorrhoids.                        - The examination was otherwise normal on direct and                         retroflexion views.                        - No specimens collected. Recommendation:        - Discharge patient to home.                        - Resume previous diet.                        - Continue present medications.                        - Repeat colonoscopy is not recommended due to current                         age (69 years or older) for screening purposes.                        - Return to referring physician as previously                         scheduled. Procedure Code(s):     --- Professional ---                        W2993, Colorectal cancer  screening; colonoscopy on                         individual at high risk Diagnosis Code(s):     --- Professional ---                        Z80.0, Family history of malignant neoplasm of                         digestive organs                        K64.0, First degree hemorrhoids                        K57.30, Diverticulosis of large intestine without                         perforation or abscess without bleeding CPT copyright 2019 [employer]. All rights reserved. The codes documented in this report are preliminary and upon coder review may  be revised to meet current compliance requirements. Andrey Farmer MD, MD 05/22/2022 10:15:08 AM Number of Addenda: 0 Note Initiated On: 05/22/2022 9:44 AM Scope Withdrawal Time: 0 hours 8 minutes 52 seconds  Total Procedure Duration: 0 hours 20 minutes 12 seconds  Estimated Blood Loss:  Estimated blood loss: none.      [facility]

## 2022-05-22 NOTE — Interval H&P Note (Signed)
History and Physical Interval Note:  05/22/2022 9:43 AM  Hayley Carter  has presented today for surgery, with the diagnosis of family hx of colon cancer.  The various methods of treatment have been discussed with the patient and family. After consideration of risks, benefits and other options for treatment, the patient has consented to  Procedure(s): COLONOSCOPY WITH PROPOFOL (N/A) as a surgical intervention.  The patient's history has been reviewed, patient examined, no change in status, stable for surgery.  I have reviewed the patient's chart and labs.  Questions were answered to the patient's satisfaction.     Lesly Rubenstein  Ok to proceed with colonoscopy

## 2022-05-22 NOTE — Transfer of Care (Signed)
Immediate Anesthesia Transfer of Care Note  Patient: Hayley Carter  Procedure(s) Performed: COLONOSCOPY WITH PROPOFOL  Patient Location: PACU  Anesthesia Type:General  Level of Consciousness: awake and sedated  Airway & Oxygen Therapy: Patient Spontanous Breathing and Patient connected to face mask oxygen  Post-op Assessment: Report given to RN and Post -op Vital signs reviewed and stable  Post vital signs: Reviewed and stable  Last Vitals:  Vitals Value Taken Time  BP    Temp    Pulse    Resp    SpO2      Last Pain:  Vitals:   05/22/22 0922  TempSrc: Temporal  PainSc: 0-No pain         Complications: No notable events documented.

## 2022-05-23 ENCOUNTER — Encounter: Payer: Self-pay | Admitting: Gastroenterology

## 2022-06-10 IMAGING — MG MM DIGITAL SCREENING BILAT W/ TOMO AND CAD
8 of 15 series · 8 of 40 positions shown · non-contrast
Comparison: None Available.

CLINICAL DATA: Screening.

EXAM:
DIGITAL SCREENING BILATERAL MAMMOGRAM WITH TOMOSYNTHESIS AND CAD
TECHNIQUE: Bilateral screening digital craniocaudal and mediolateral oblique
mammograms were obtained. Bilateral screening digital breast
tomosynthesis was performed. The images were evaluated with
computer-aided detection.

[R CC synth-2D (1 of 2)]
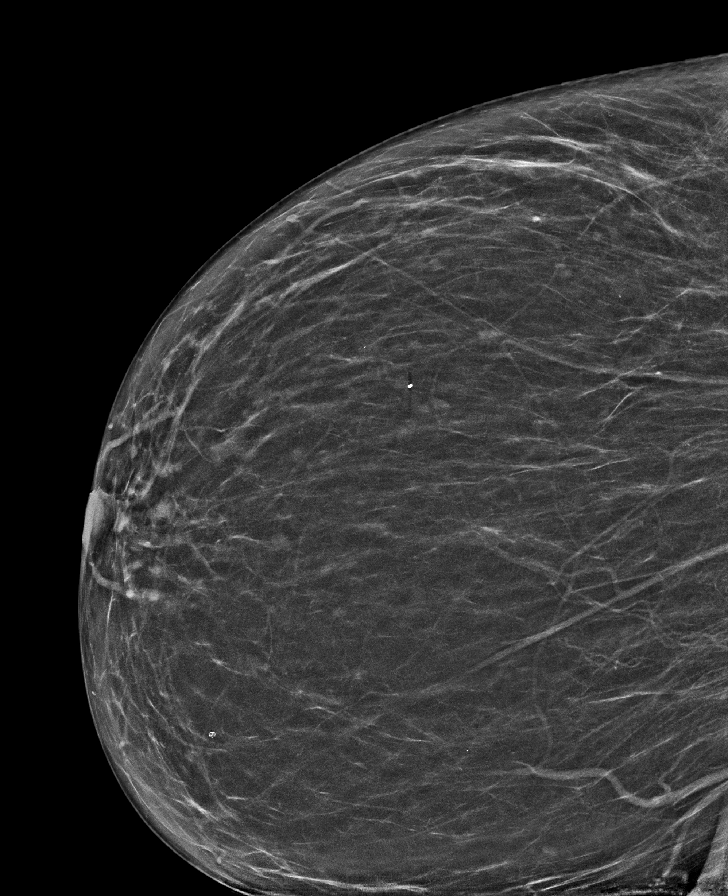

[L MLO synth-2D (1 of 2)]
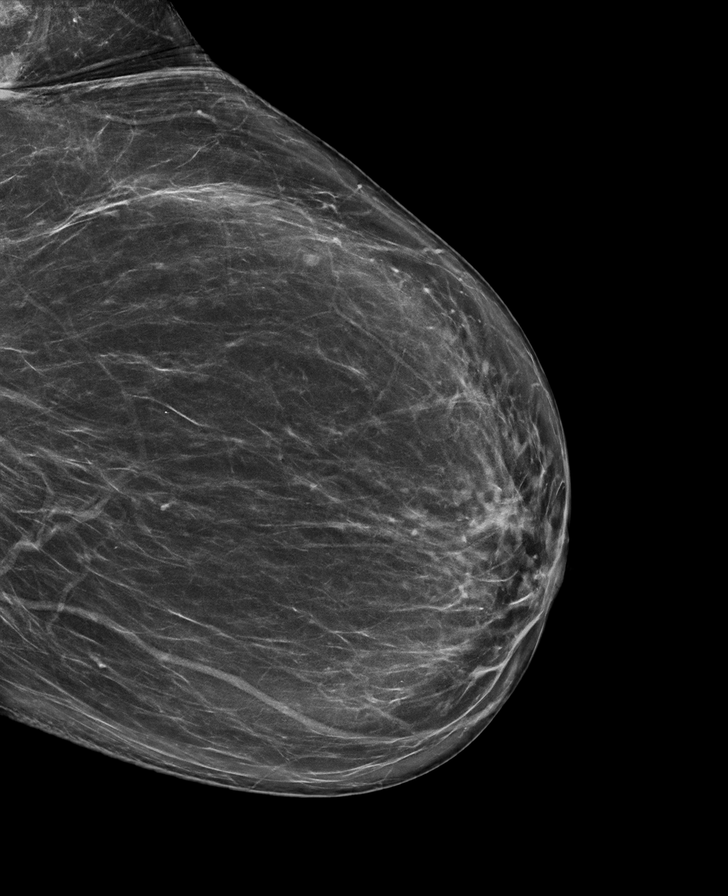

[L MLO synth-2D (2 of 2)]
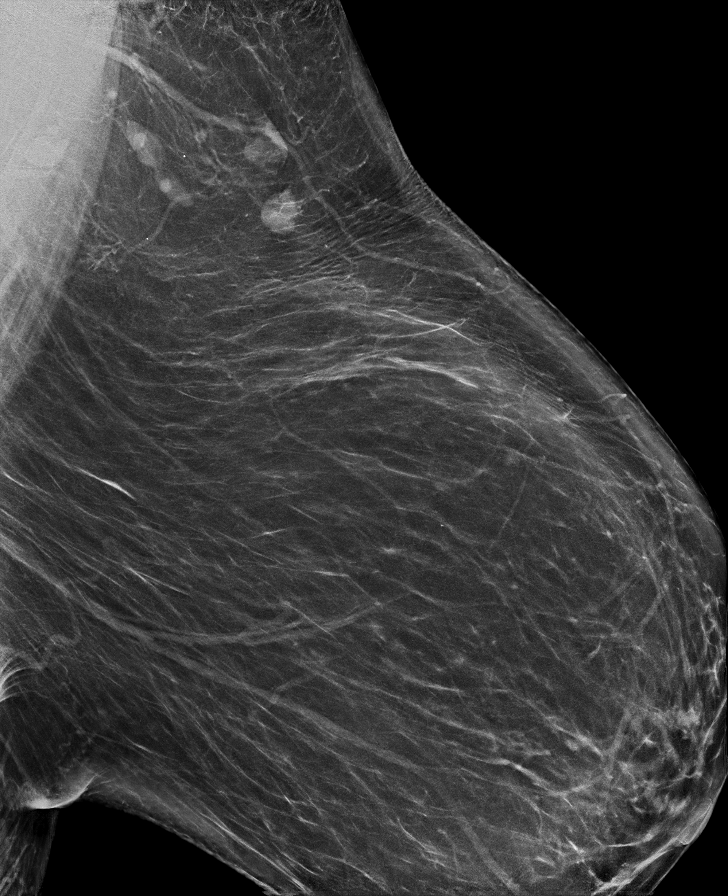

[R CC synth-2D (2 of 2)]
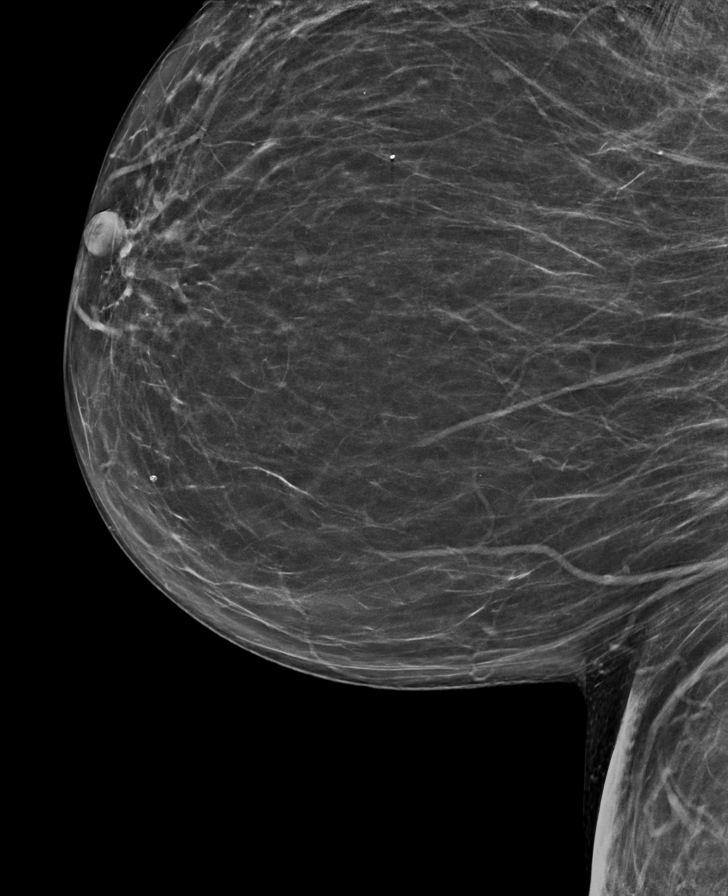

[L CC synth-2D (1 of 2)]
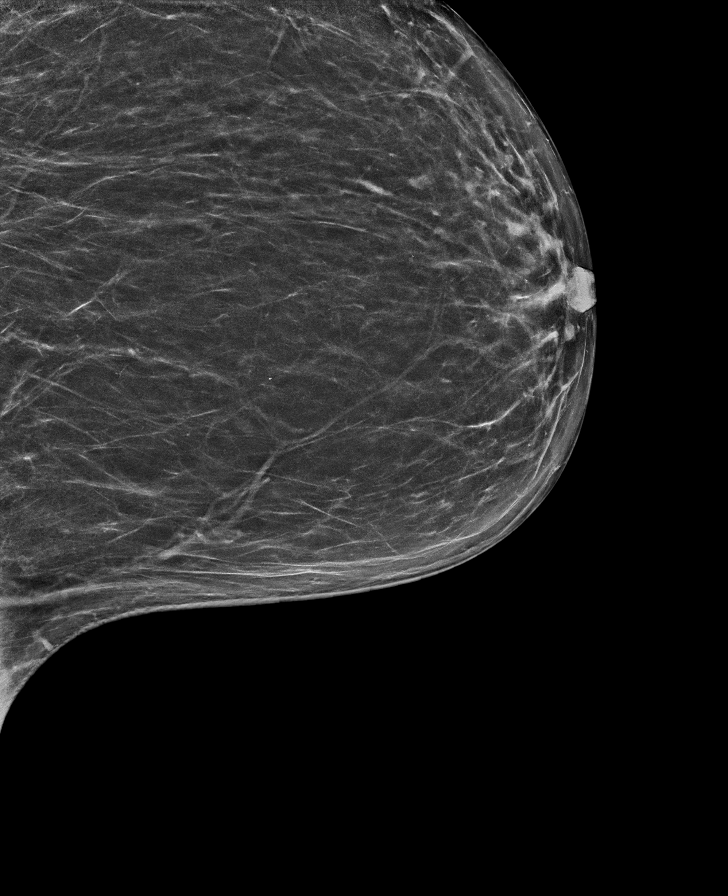

[R MLO synth-2D]
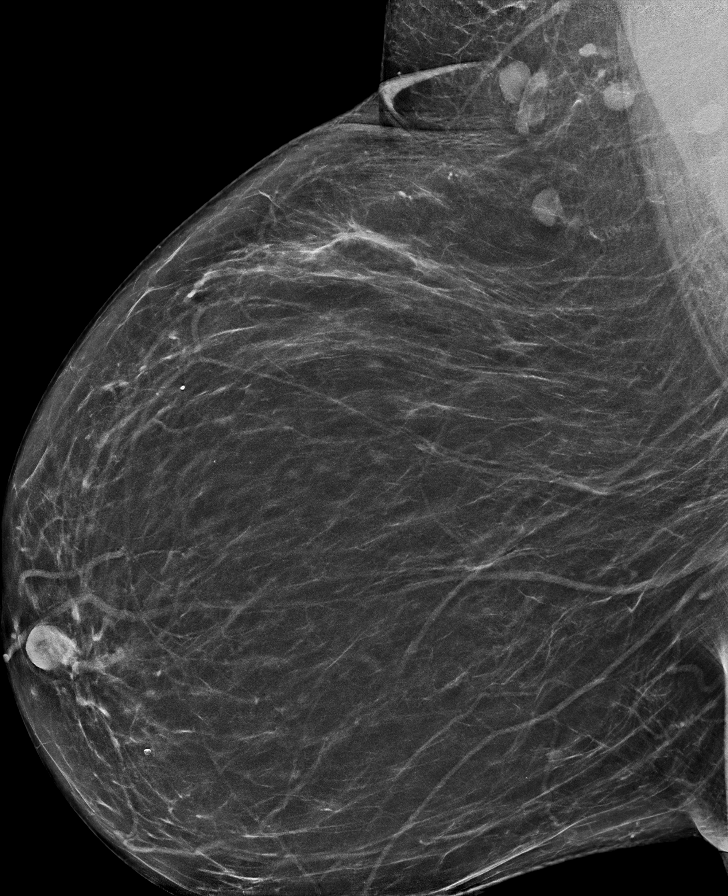

[L CC synth-2D (2 of 2)]
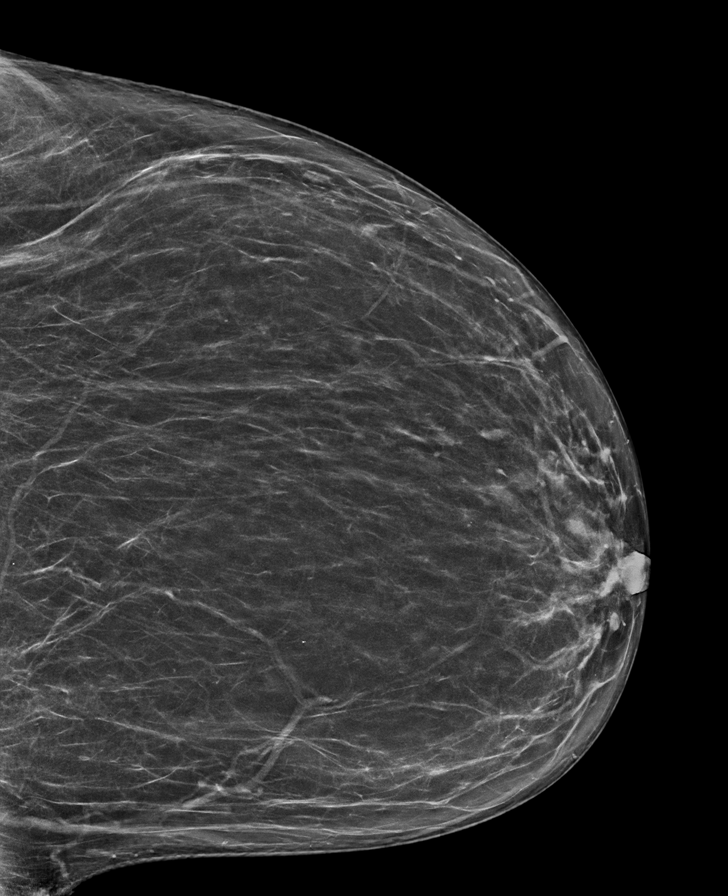

[R CC tomo · tomo slice 49/72.0]
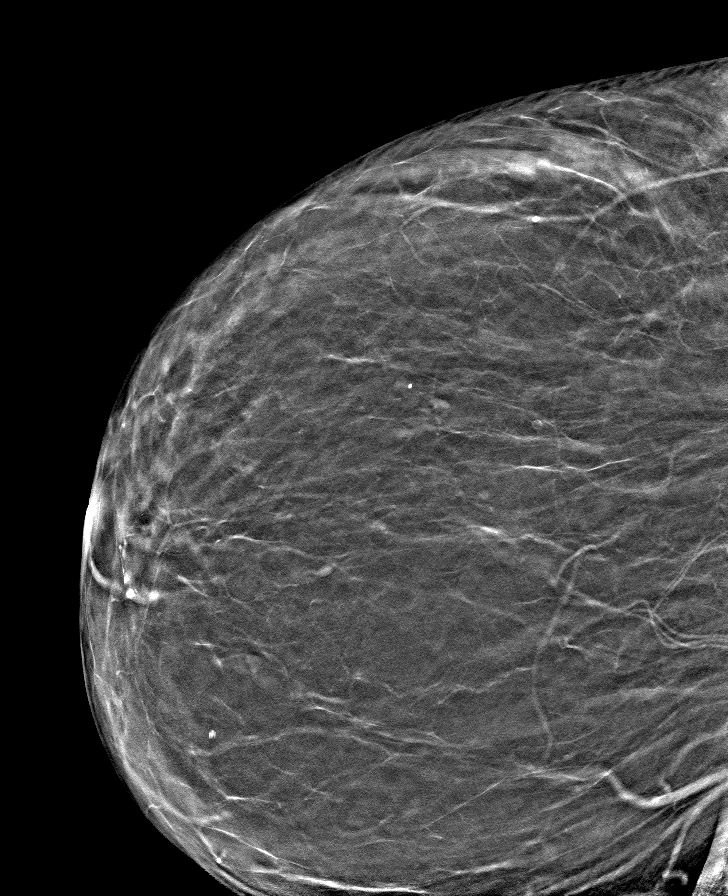

[8 of 40 positions shown; findings below may reference images not displayed]

ACR Breast Density Category b: There are scattered areas of
fibroglandular density.
FINDINGS: There are no findings suspicious for malignancy.
IMPRESSION: No mammographic evidence of malignancy. A result letter of this
screening mammogram will be mailed directly to the patient.

RECOMMENDATION:
Screening mammogram in one year. (Code:Q0-9-1FT)

BI-RADS CATEGORY  1: Negative.

## 2023-11-28 DIAGNOSIS — E559 Vitamin D deficiency, unspecified: Secondary | ICD-10-CM | POA: Diagnosis not present

## 2023-11-28 DIAGNOSIS — E11630 Type 2 diabetes mellitus with periodontal disease: Secondary | ICD-10-CM | POA: Diagnosis not present

## 2023-11-28 DIAGNOSIS — I152 Hypertension secondary to endocrine disorders: Secondary | ICD-10-CM | POA: Diagnosis not present

## 2023-11-28 DIAGNOSIS — E1169 Type 2 diabetes mellitus with other specified complication: Secondary | ICD-10-CM | POA: Diagnosis not present

## 2023-11-28 DIAGNOSIS — M170 Bilateral primary osteoarthritis of knee: Secondary | ICD-10-CM | POA: Diagnosis not present

## 2023-11-28 DIAGNOSIS — E7800 Pure hypercholesterolemia, unspecified: Secondary | ICD-10-CM | POA: Diagnosis not present

## 2023-11-28 DIAGNOSIS — Z6841 Body Mass Index (BMI) 40.0 and over, adult: Secondary | ICD-10-CM | POA: Diagnosis not present

## 2023-11-28 DIAGNOSIS — Z79899 Other long term (current) drug therapy: Secondary | ICD-10-CM | POA: Diagnosis not present

## 2023-11-28 DIAGNOSIS — E538 Deficiency of other specified B group vitamins: Secondary | ICD-10-CM | POA: Diagnosis not present

## 2023-11-28 DIAGNOSIS — J301 Allergic rhinitis due to pollen: Secondary | ICD-10-CM | POA: Diagnosis not present

## 2023-11-28 DIAGNOSIS — E1159 Type 2 diabetes mellitus with other circulatory complications: Secondary | ICD-10-CM | POA: Diagnosis not present

## 2023-11-28 DIAGNOSIS — F411 Generalized anxiety disorder: Secondary | ICD-10-CM | POA: Diagnosis not present

## 2023-11-28 DIAGNOSIS — Z0000 Encounter for general adult medical examination without abnormal findings: Secondary | ICD-10-CM | POA: Diagnosis not present

## 2023-11-28 DIAGNOSIS — Z800 Family history of malignant neoplasm of digestive organs: Secondary | ICD-10-CM | POA: Diagnosis not present

## 2023-11-30 ENCOUNTER — Other Ambulatory Visit: Payer: Self-pay | Admitting: Gerontology

## 2023-11-30 ENCOUNTER — Encounter: Payer: Self-pay | Admitting: Family Medicine

## 2023-11-30 DIAGNOSIS — Z1231 Encounter for screening mammogram for malignant neoplasm of breast: Secondary | ICD-10-CM

## 2024-01-07 ENCOUNTER — Ambulatory Visit
Admission: RE | Admit: 2024-01-07 | Discharge: 2024-01-07 | Disposition: A | Discharge status: Home / Self Care | Source: Ambulatory Visit | Attending: Gerontology | Admitting: Gerontology

## 2024-01-07 DIAGNOSIS — Z1231 Encounter for screening mammogram for malignant neoplasm of breast: Secondary | ICD-10-CM | POA: Diagnosis not present

## 2024-01-17 DIAGNOSIS — E119 Type 2 diabetes mellitus without complications: Secondary | ICD-10-CM | POA: Diagnosis not present

## 2024-01-22 DIAGNOSIS — H6123 Impacted cerumen, bilateral: Secondary | ICD-10-CM | POA: Diagnosis not present

## 2024-01-22 DIAGNOSIS — H902 Conductive hearing loss, unspecified: Secondary | ICD-10-CM | POA: Diagnosis not present

## 2024-04-07 DIAGNOSIS — G8929 Other chronic pain: Secondary | ICD-10-CM | POA: Diagnosis not present

## 2024-04-07 DIAGNOSIS — M25561 Pain in right knee: Secondary | ICD-10-CM | POA: Diagnosis not present

## 2024-04-07 DIAGNOSIS — M5442 Lumbago with sciatica, left side: Secondary | ICD-10-CM | POA: Diagnosis not present

## 2024-04-07 DIAGNOSIS — E1169 Type 2 diabetes mellitus with other specified complication: Secondary | ICD-10-CM | POA: Diagnosis not present

## 2024-04-07 DIAGNOSIS — M170 Bilateral primary osteoarthritis of knee: Secondary | ICD-10-CM | POA: Diagnosis not present

## 2024-04-07 DIAGNOSIS — Z6841 Body Mass Index (BMI) 40.0 and over, adult: Secondary | ICD-10-CM | POA: Diagnosis not present

## 2024-05-20 ENCOUNTER — Other Ambulatory Visit (HOSPITAL_COMMUNITY): Payer: Self-pay

## 2024-05-21 ENCOUNTER — Other Ambulatory Visit: Payer: Self-pay

## 2024-05-21 ENCOUNTER — Other Ambulatory Visit (HOSPITAL_COMMUNITY): Payer: Self-pay

## 2024-05-21 MED ORDER — GABAPENTIN 100 MG PO CAPS
200.0000 mg | ORAL_CAPSULE | Freq: Two times a day (BID) | ORAL | 3 refills | Status: AC
  Filled 2024-05-21: qty 360, 90d supply, fill #0

## 2024-05-21 MED ORDER — PAROXETINE HCL 40 MG PO TABS
40.0000 mg | ORAL_TABLET | Freq: Every day | ORAL | 0 refills | Status: DC
  Filled 2024-05-21: qty 90, 90d supply, fill #0

## 2024-05-22 ENCOUNTER — Other Ambulatory Visit (HOSPITAL_COMMUNITY): Payer: Self-pay

## 2024-05-26 ENCOUNTER — Other Ambulatory Visit (HOSPITAL_COMMUNITY): Payer: Self-pay

## 2024-05-26 MED ORDER — GABAPENTIN 100 MG PO CAPS: 200.0000 mg | ORAL_CAPSULE | Freq: Two times a day (BID) | ORAL | 3 refills | Status: AC

## 2024-05-26 MED ORDER — PAROXETINE HCL 40 MG PO TABS
40.0000 mg | ORAL_TABLET | Freq: Every day | ORAL | 3 refills | Status: AC
  Filled 2024-05-26: qty 90, 90d supply, fill #0

## 2024-05-30 ENCOUNTER — Other Ambulatory Visit (HOSPITAL_COMMUNITY): Payer: Self-pay

## 2024-05-30 ENCOUNTER — Other Ambulatory Visit: Payer: Self-pay

## 2024-05-30 DIAGNOSIS — Z1331 Encounter for screening for depression: Secondary | ICD-10-CM | POA: Diagnosis not present

## 2024-05-30 DIAGNOSIS — E559 Vitamin D deficiency, unspecified: Secondary | ICD-10-CM | POA: Diagnosis not present

## 2024-05-30 DIAGNOSIS — E7800 Pure hypercholesterolemia, unspecified: Secondary | ICD-10-CM | POA: Diagnosis not present

## 2024-05-30 DIAGNOSIS — J301 Allergic rhinitis due to pollen: Secondary | ICD-10-CM | POA: Diagnosis not present

## 2024-05-30 DIAGNOSIS — E538 Deficiency of other specified B group vitamins: Secondary | ICD-10-CM | POA: Diagnosis not present

## 2024-05-30 DIAGNOSIS — I152 Hypertension secondary to endocrine disorders: Secondary | ICD-10-CM | POA: Diagnosis not present

## 2024-05-30 DIAGNOSIS — E1159 Type 2 diabetes mellitus with other circulatory complications: Secondary | ICD-10-CM | POA: Diagnosis not present

## 2024-05-30 DIAGNOSIS — E1169 Type 2 diabetes mellitus with other specified complication: Secondary | ICD-10-CM | POA: Diagnosis not present

## 2024-05-30 DIAGNOSIS — Z09 Encounter for follow-up examination after completed treatment for conditions other than malignant neoplasm: Secondary | ICD-10-CM | POA: Diagnosis not present

## 2024-05-30 DIAGNOSIS — F411 Generalized anxiety disorder: Secondary | ICD-10-CM | POA: Diagnosis not present

## 2024-05-30 MED ORDER — DAPAGLIFLOZIN PROPANEDIOL 10 MG PO TABS
10.0000 mg | ORAL_TABLET | Freq: Every day | ORAL | 11 refills | Status: AC
  Filled 2024-05-30: qty 30, 30d supply, fill #0
  Filled 2024-06-22: qty 30, 30d supply, fill #1

## 2024-05-30 MED ORDER — PIOGLITAZONE HCL 15 MG PO TABS
15.0000 mg | ORAL_TABLET | Freq: Every day | ORAL | 3 refills | Status: AC
  Filled 2024-05-30 – 2024-06-23 (×18): qty 90, 90d supply, fill #0
  Filled ????-??-??: fill #0

## 2024-05-30 MED ORDER — VALSARTAN-HYDROCHLOROTHIAZIDE 160-25 MG PO TABS
1.0000 | ORAL_TABLET | Freq: Every day | ORAL | 1 refills | Status: AC
  Filled 2024-05-30 – 2024-06-01 (×3): qty 90, 90d supply, fill #0

## 2024-05-30 MED ORDER — SIMVASTATIN 40 MG PO TABS
40.0000 mg | ORAL_TABLET | Freq: Every day | ORAL | 3 refills | Status: AC
  Filled 2024-05-30 – 2024-06-06 (×4): qty 90, 90d supply, fill #0

## 2024-05-31 ENCOUNTER — Other Ambulatory Visit (HOSPITAL_COMMUNITY): Payer: Self-pay

## 2024-06-01 ENCOUNTER — Other Ambulatory Visit (HOSPITAL_COMMUNITY): Payer: Self-pay

## 2024-06-02 ENCOUNTER — Other Ambulatory Visit: Payer: Self-pay

## 2024-06-02 ENCOUNTER — Other Ambulatory Visit (HOSPITAL_COMMUNITY): Payer: Self-pay

## 2024-06-02 DIAGNOSIS — I152 Hypertension secondary to endocrine disorders: Secondary | ICD-10-CM | POA: Diagnosis not present

## 2024-06-02 DIAGNOSIS — E1159 Type 2 diabetes mellitus with other circulatory complications: Secondary | ICD-10-CM | POA: Diagnosis not present

## 2024-06-02 DIAGNOSIS — E7800 Pure hypercholesterolemia, unspecified: Secondary | ICD-10-CM | POA: Diagnosis not present

## 2024-06-02 DIAGNOSIS — E1169 Type 2 diabetes mellitus with other specified complication: Secondary | ICD-10-CM | POA: Diagnosis not present

## 2024-06-04 ENCOUNTER — Other Ambulatory Visit (HOSPITAL_COMMUNITY): Payer: Self-pay

## 2024-06-05 ENCOUNTER — Other Ambulatory Visit (HOSPITAL_COMMUNITY): Payer: Self-pay

## 2024-06-06 ENCOUNTER — Other Ambulatory Visit: Payer: Self-pay

## 2024-06-07 ENCOUNTER — Other Ambulatory Visit (HOSPITAL_COMMUNITY): Payer: Self-pay

## 2024-06-08 ENCOUNTER — Other Ambulatory Visit (HOSPITAL_COMMUNITY): Payer: Self-pay

## 2024-06-09 ENCOUNTER — Other Ambulatory Visit (HOSPITAL_COMMUNITY): Payer: Self-pay

## 2024-06-10 ENCOUNTER — Other Ambulatory Visit (HOSPITAL_COMMUNITY): Payer: Self-pay

## 2024-06-11 ENCOUNTER — Other Ambulatory Visit (HOSPITAL_COMMUNITY): Payer: Self-pay

## 2024-06-11 ENCOUNTER — Other Ambulatory Visit: Payer: Self-pay

## 2024-06-12 ENCOUNTER — Other Ambulatory Visit (HOSPITAL_COMMUNITY): Payer: Self-pay

## 2024-06-13 ENCOUNTER — Other Ambulatory Visit (HOSPITAL_COMMUNITY): Payer: Self-pay

## 2024-06-14 ENCOUNTER — Other Ambulatory Visit (HOSPITAL_COMMUNITY): Payer: Self-pay

## 2024-06-16 ENCOUNTER — Other Ambulatory Visit: Payer: Self-pay

## 2024-06-17 ENCOUNTER — Other Ambulatory Visit (HOSPITAL_COMMUNITY): Payer: Self-pay

## 2024-06-18 ENCOUNTER — Other Ambulatory Visit (HOSPITAL_COMMUNITY): Payer: Self-pay

## 2024-06-19 ENCOUNTER — Other Ambulatory Visit (HOSPITAL_COMMUNITY): Payer: Self-pay

## 2024-06-19 ENCOUNTER — Other Ambulatory Visit: Payer: Self-pay

## 2024-06-20 ENCOUNTER — Other Ambulatory Visit (HOSPITAL_COMMUNITY): Payer: Self-pay

## 2024-06-21 ENCOUNTER — Other Ambulatory Visit (HOSPITAL_COMMUNITY): Payer: Self-pay

## 2024-06-22 ENCOUNTER — Other Ambulatory Visit (HOSPITAL_COMMUNITY): Payer: Self-pay

## 2024-06-23 ENCOUNTER — Other Ambulatory Visit: Payer: Self-pay

## 2024-06-23 ENCOUNTER — Other Ambulatory Visit (HOSPITAL_COMMUNITY): Payer: Self-pay

## 2024-06-25 DIAGNOSIS — M170 Bilateral primary osteoarthritis of knee: Secondary | ICD-10-CM | POA: Diagnosis not present

## 2024-07-02 DIAGNOSIS — M170 Bilateral primary osteoarthritis of knee: Secondary | ICD-10-CM | POA: Diagnosis not present

## 2024-07-04 ENCOUNTER — Other Ambulatory Visit (HOSPITAL_COMMUNITY): Payer: Self-pay

## 2024-07-09 DIAGNOSIS — M170 Bilateral primary osteoarthritis of knee: Secondary | ICD-10-CM | POA: Diagnosis not present

## 2024-07-22 DIAGNOSIS — H6123 Impacted cerumen, bilateral: Secondary | ICD-10-CM | POA: Diagnosis not present

## 2024-07-22 DIAGNOSIS — H900 Conductive hearing loss, bilateral: Secondary | ICD-10-CM | POA: Diagnosis not present

## 2024-09-10 NOTE — Progress Notes (Signed)
 Hayley Carter                                          MRN: [id number]   09/10/2024   The [facility] Quality Team Specialist reviewed this patient medical record for the purposes of chart review for care gap closure. The following were reviewed: abstraction for care gap closure-glycemic status assessment.    [facility] Quality Team
